# Patient Record
Sex: Female | Born: 1985 | Race: Black or African American | Hispanic: No | Marital: Single | Smoking: Never smoker
Health system: Southern US, Community
[De-identification: ages and names within clinical notes are randomized; demographics above are authoritative.]

## PROBLEM LIST (undated history)

## (undated) DIAGNOSIS — H9190 Unspecified hearing loss, unspecified ear: Secondary | ICD-10-CM

---
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
## 2020-11-28 ENCOUNTER — Other Ambulatory Visit: Payer: Self-pay

## 2020-12-17 ENCOUNTER — Other Ambulatory Visit: Payer: Self-pay

## 2020-12-20 ENCOUNTER — Other Ambulatory Visit: Payer: Self-pay

## 2020-12-20 ENCOUNTER — Emergency Department (HOSPITAL_COMMUNITY)
Admission: EM | Admit: 2020-12-20 | Discharge: 2020-12-20 | Disposition: A | Discharge status: Home / Self Care | Payer: BC Managed Care – PPO | Attending: Emergency Medicine | Admitting: Emergency Medicine

## 2020-12-20 DIAGNOSIS — R059 Cough, unspecified: Secondary | ICD-10-CM

## 2020-12-20 DIAGNOSIS — R0602 Shortness of breath: Secondary | ICD-10-CM | POA: Diagnosis present

## 2020-12-20 DIAGNOSIS — U071 COVID-19: Secondary | ICD-10-CM

## 2020-12-20 MED ORDER — ACETAMINOPHEN ER 650 MG PO TBCR: 650.0000 mg | EXTENDED_RELEASE_TABLET | Freq: Three times a day (TID) | ORAL | 0 refills | Status: DC | PRN

## 2020-12-20 MED ORDER — ONDANSETRON 4 MG PO TBDP: 4.0000 mg | ORAL_TABLET | Freq: Three times a day (TID) | ORAL | 0 refills | Status: DC | PRN

## 2020-12-20 MED ORDER — BENZONATATE 100 MG PO CAPS: 100.0000 mg | ORAL_CAPSULE | Freq: Three times a day (TID) | ORAL | 0 refills | Status: DC

## 2020-12-20 MED ORDER — ALBUTEROL SULFATE HFA 108 (90 BASE) MCG/ACT IN AERS: 2.0000 | INHALATION_SPRAY | RESPIRATORY_TRACT | Status: DC | PRN

## 2020-12-20 MED ORDER — BENZONATATE 100 MG PO CAPS
100.0000 mg | ORAL_CAPSULE | Freq: Once | ORAL | Status: AC
  Administered 2020-12-20: 100 mg via ORAL
  Filled 2020-12-20: qty 1

## 2020-12-20 NOTE — Progress Notes (Signed)
PT in Waiting Room- therefore Adult Wheeze Protocal per Dr. Eliot Ford order not completed at this time.

## 2020-12-20 NOTE — Discharge Instructions (Signed)
As discussed, I suspect her symptoms are related to Covid.  I am sending home with cough medication, nausea medication, and Tylenol.  Take as needed.  Please follow-up with PCP if symptoms not improved within the next week.  Return to the ER for new or worsening symptoms.  I have placed a referral for the antibody infusions.  You will receive a phone call if you qualify for the infusions.

## 2020-12-20 NOTE — ED Provider Notes (Signed)
[facility] DEPT Provider Note   CSN: [id number] Arrival date & time: 12/20/20  1650     History Chief Complaint  Patient presents with   Covid Positive   Cough    Caitlyn King is a 35 y.o. female with no significant past medical history presents to the ED due to severe cough and shortness of breath x1 week.  Patient tested positive for Covid on 12/19/2020 with symptom onset on 12/12/2020.  Patient admits to worsening dry cough.  She has been taking Robitussin and Mucinex with mild relief.  Patient also admits to shortness of breath worse when coughing.  Denies associated chest pain and lower extremity edema.  Denies history of blood clots, recent surgeries, recent long immobilizations, and hormonal treatments.  Patient requesting antibody infusion.  Patient is unvaccinated.  Admits to intermittent fevers.  Denies abdominal pain, nausea, vomiting, diarrhea.  History obtained from patient and past medical records. No interpreter used during encounter.      No past medical history on file.  There are no problems to display for this patient.    OB History   No obstetric history on file.     No family history on file.     Home Medications Prior to Admission medications   Medication Sig Start Date End Date Taking? Authorizing Provider  acetaminophen (TYLENOL 8 HOUR) 650 MG CR tablet Take 1 tablet (650 mg total) by mouth every 8 (eight) hours as needed for pain. 12/20/20  Yes Keo Schirmer, Druscilla Brownie, PA-C  benzonatate (TESSALON) 100 MG capsule Take 1 capsule (100 mg total) by mouth every 8 (eight) hours. 12/20/20  Yes Sandrina Heaton C, PA-C  ondansetron (ZOFRAN ODT) 4 MG disintegrating tablet Take 1 tablet (4 mg total) by mouth every 8 (eight) hours as needed for nausea or vomiting. 12/20/20  Yes Suzy Bouchard, PA-C    Allergies    Patient has no allergy information on record.  Review of Systems   Review of Systems  Constitutional: Positive for  chills and fever.  Respiratory: Positive for cough and shortness of breath.   Cardiovascular: Negative for chest pain and leg swelling.  Gastrointestinal: Negative for abdominal pain, diarrhea, nausea and vomiting.  All other systems reviewed and are negative.   Physical Exam Updated Vital Signs BP 100/74    Pulse 90    Temp 99.6 F (37.6 C) (Oral)    Resp 18    Ht 5\' 7"  (1.702 m)    Wt 108 kg    SpO2 97%    BMI 37.28 kg/m   Physical Exam Vitals and nursing note reviewed.  Constitutional:      General: She is not in acute distress.    Appearance: She is not ill-appearing.  HENT:     Head: Normocephalic.  Eyes:     Pupils: Pupils are equal, round, and reactive to light.  Cardiovascular:     Rate and Rhythm: Normal rate and regular rhythm.     Pulses: Normal pulses.     Heart sounds: Normal heart sounds. No murmur heard. No friction rub. No gallop.   Pulmonary:     Effort: Pulmonary effort is normal.     Breath sounds: Normal breath sounds.     Comments: Respirations equal and unlabored, patient able to speak in full sentences, lungs clear to auscultation bilaterally. Dry cough on exam. Abdominal:     General: Abdomen is flat. Bowel sounds are normal. There is no distension.  Palpations: Abdomen is soft.     Tenderness: There is no abdominal tenderness. There is no guarding or rebound.  Musculoskeletal:     Cervical back: Neck supple.     Comments: No lower extremity edema. Negative homan sign bilaterally.  Skin:    General: Skin is warm and dry.  Neurological:     General: No focal deficit present.     Mental Status: She is alert.  Psychiatric:        Mood and Affect: Mood normal.        Behavior: Behavior normal.     ED Results / Procedures / Treatments   Labs (all labs ordered are listed, but only abnormal results are displayed) Labs Reviewed - No data to display  EKG None  Radiology No results found.  Procedures Procedures (including critical care  time)  Medications Ordered in ED Medications  benzonatate (TESSALON) capsule 100 mg (100 mg Oral Given 12/20/20 1948)    ED Course  I have reviewed the triage vital signs and the nursing notes.  Pertinent labs & imaging results that were available during my care of the patient were reviewed by me and considered in my medical decision making (see chart for details).    MDM Rules/Calculators/A&P                         35 year old female presents to the ED due to cough and shortness of breath.  Patient has a positive for Covid on 12/19/2020.  She is currently unvaccinated.  Shortness of breath worse while coughing.  Denies associated chest pain and lower extremity edema.  Upon arrival, patient afebrile with mild tachycardia with O2 saturation at 92%.  During my initial evaluation vitals rechecked with heart rate in the 90s and O2 saturation at 97%.  Patient in no acute distress and nontoxic-appearing.  Physical exam reassuring.  Lungs clear to auscultation bilaterally.  No Rales, rhonchi, or wheeze.  Low suspicion for pneumonia.  No meningismus to suggest meningitis.  Abdomen soft, nondistended, nontender.  No lower extremity edema.  Negative Homans' sign bilaterally.  Low suspicion for PE/DVT.  Suspect symptoms related to Covid infection.  Patient able to ambulate here in the ED and maintained O2 saturation above 95%.  Will discharge with symptomatic treatment.  Quarantine guidelines discussed with patient.  Referral for Mab infusion. Strict ED precautions discussed with patient. Patient states understanding and agrees to plan. Patient discharged home in no acute distress and stable vitals  Caitlyn King was evaluated in Emergency Department on 12/20/2020 for the symptoms described in the history of present illness. She was evaluated in the context of the global COVID-19 pandemic, which necessitated consideration that the patient might be at risk for infection with the SARS-CoV-2 virus that causes  COVID-19. Institutional protocols and algorithms that pertain to the evaluation of patients at risk for COVID-19 are in a state of rapid change based on information released by regulatory bodies including the CDC and federal and state organizations. These policies and algorithms were followed during the patient's care in the ED.  Final Clinical Impression(s) / ED Diagnoses Final diagnoses:  [id number] virus infection  Cough    Rx / DC Orders ED Discharge Orders         Ordered    benzonatate (TESSALON) 100 MG capsule  Every 8 hours        12/20/20 1946    ondansetron (ZOFRAN ODT) 4 MG disintegrating tablet  Every 8  hours PRN        12/20/20 1946    acetaminophen (TYLENOL 8 HOUR) 650 MG CR tablet  Every 8 hours PRN        12/20/20 1946           Jesusita Oka 12/20/20 2027    Gwyneth Sprout, MD 12/24/20 2253

## 2020-12-20 NOTE — ED Notes (Signed)
Pt ambulated in room unassisted with a steady gait. Pt O2 reading 96% RA while ambulating.

## 2020-12-20 NOTE — ED Notes (Signed)
Pt verbalized dc instructions and follow up care. Alert and ambulatory. No iv. Vitals stable prior to dc  

## 2020-12-20 NOTE — ED Triage Notes (Signed)
Pt POV with reports recently being dx with COVID 19 on 12/19/20.  Pt c/o worsening cough, ShOB.  Pt able to speak in full sentences at time of triage, breathing unlabored.   Wants mAB infusion.

## 2020-12-23 ENCOUNTER — Other Ambulatory Visit: Payer: Self-pay

## 2020-12-23 ENCOUNTER — Emergency Department (HOSPITAL_COMMUNITY): Payer: BC Managed Care – PPO

## 2020-12-23 ENCOUNTER — Inpatient Hospital Stay (HOSPITAL_COMMUNITY)
Admission: EM | Admit: 2020-12-23 | Discharge: 2020-12-28 | DRG: 871 | Disposition: A | Discharge status: Home / Self Care | Payer: BC Managed Care – PPO | Attending: Family Medicine | Admitting: Family Medicine

## 2020-12-23 ENCOUNTER — Encounter (HOSPITAL_COMMUNITY): Payer: Self-pay

## 2020-12-23 DIAGNOSIS — R778 Other specified abnormalities of plasma proteins: Secondary | ICD-10-CM | POA: Diagnosis present

## 2020-12-23 DIAGNOSIS — T380X5A Adverse effect of glucocorticoids and synthetic analogues, initial encounter: Secondary | ICD-10-CM | POA: Diagnosis not present

## 2020-12-23 DIAGNOSIS — R0902 Hypoxemia: Secondary | ICD-10-CM

## 2020-12-23 DIAGNOSIS — E878 Other disorders of electrolyte and fluid balance, not elsewhere classified: Secondary | ICD-10-CM | POA: Diagnosis present

## 2020-12-23 DIAGNOSIS — E669 Obesity, unspecified: Secondary | ICD-10-CM | POA: Diagnosis present

## 2020-12-23 DIAGNOSIS — U071 COVID-19: Secondary | ICD-10-CM | POA: Diagnosis not present

## 2020-12-23 DIAGNOSIS — Z6837 Body mass index (BMI) 37.0-37.9, adult: Secondary | ICD-10-CM

## 2020-12-23 DIAGNOSIS — A4189 Other specified sepsis: Principal | ICD-10-CM | POA: Diagnosis present

## 2020-12-23 DIAGNOSIS — D649 Anemia, unspecified: Secondary | ICD-10-CM

## 2020-12-23 DIAGNOSIS — J1282 Pneumonia due to coronavirus disease 2019: Secondary | ICD-10-CM | POA: Diagnosis present

## 2020-12-23 DIAGNOSIS — R739 Hyperglycemia, unspecified: Secondary | ICD-10-CM | POA: Diagnosis not present

## 2020-12-23 DIAGNOSIS — R9431 Abnormal electrocardiogram [ECG] [EKG]: Secondary | ICD-10-CM | POA: Diagnosis present

## 2020-12-23 DIAGNOSIS — H9190 Unspecified hearing loss, unspecified ear: Secondary | ICD-10-CM | POA: Diagnosis present

## 2020-12-23 DIAGNOSIS — A419 Sepsis, unspecified organism: Secondary | ICD-10-CM

## 2020-12-23 DIAGNOSIS — N920 Excessive and frequent menstruation with regular cycle: Secondary | ICD-10-CM | POA: Diagnosis present

## 2020-12-23 DIAGNOSIS — J9601 Acute respiratory failure with hypoxia: Secondary | ICD-10-CM | POA: Diagnosis present

## 2020-12-23 DIAGNOSIS — D259 Leiomyoma of uterus, unspecified: Secondary | ICD-10-CM | POA: Diagnosis present

## 2020-12-23 DIAGNOSIS — D500 Iron deficiency anemia secondary to blood loss (chronic): Secondary | ICD-10-CM | POA: Diagnosis present

## 2020-12-23 DIAGNOSIS — E538 Deficiency of other specified B group vitamins: Secondary | ICD-10-CM | POA: Diagnosis present

## 2020-12-23 DIAGNOSIS — E871 Hypo-osmolality and hyponatremia: Secondary | ICD-10-CM | POA: Diagnosis present

## 2020-12-23 DIAGNOSIS — A0839 Other viral enteritis: Secondary | ICD-10-CM | POA: Diagnosis present

## 2020-12-23 HISTORY — DX: Unspecified hearing loss, unspecified ear: H91.90

## 2020-12-23 LAB — CBC WITH DIFFERENTIAL/PLATELET
Abs Immature Granulocytes: 0.02 10*3/uL (ref 0.00–0.07)
Basophils Absolute: 0 10*3/uL (ref 0.0–0.1)
Basophils Relative: 0 %
Eosinophils Absolute: 0 10*3/uL (ref 0.0–0.5)
Eosinophils Relative: 0 %
HCT: 13.9 % — ABNORMAL LOW (ref 36.0–46.0)
Hemoglobin: 3.4 g/dL — CL (ref 12.0–15.0)
Immature Granulocytes: 1 %
Lymphocytes Relative: 24 %
Lymphs Abs: 0.6 10*3/uL — ABNORMAL LOW (ref 0.7–4.0)
MCH: 15.1 pg — ABNORMAL LOW (ref 26.0–34.0)
MCHC: 24.5 g/dL — ABNORMAL LOW (ref 30.0–36.0)
MCV: 61.8 fL — ABNORMAL LOW (ref 80.0–100.0)
Monocytes Absolute: 0.3 10*3/uL (ref 0.1–1.0)
Monocytes Relative: 10 %
Neutro Abs: 1.8 10*3/uL (ref 1.7–7.7)
Neutrophils Relative %: 65 %
Platelets: 313 10*3/uL (ref 150–400)
RBC: 2.25 MIL/uL — ABNORMAL LOW (ref 3.87–5.11)
RDW: 22.8 % — ABNORMAL HIGH (ref 11.5–15.5)
WBC: 2.7 10*3/uL — ABNORMAL LOW (ref 4.0–10.5)
nRBC: 8.2 % — ABNORMAL HIGH (ref 0.0–0.2)

## 2020-12-23 LAB — COMPREHENSIVE METABOLIC PANEL
ALT: 14 U/L (ref 0–44)
AST: 32 U/L (ref 15–41)
Albumin: 3.3 g/dL — ABNORMAL LOW (ref 3.5–5.0)
Alkaline Phosphatase: 58 U/L (ref 38–126)
Anion gap: 11 (ref 5–15)
BUN: 16 mg/dL (ref 6–20)
CO2: 24 mmol/L (ref 22–32)
Calcium: 8.1 mg/dL — ABNORMAL LOW (ref 8.9–10.3)
Chloride: 91 mmol/L — ABNORMAL LOW (ref 98–111)
Creatinine, Ser: 0.88 mg/dL (ref 0.44–1.00)
GFR, Estimated: >60 mL/min (ref >60)
Glucose, Bld: 107 mg/dL — ABNORMAL HIGH (ref 70–99)
Potassium: 4.2 mmol/L (ref 3.5–5.1)
Sodium: 126 mmol/L — ABNORMAL LOW (ref 135–145)
Total Bilirubin: 0.8 mg/dL (ref 0.3–1.2)
Total Protein: 6.3 g/dL — ABNORMAL LOW (ref 6.5–8.1)

## 2020-12-23 LAB — I-STAT BETA HCG BLOOD, ED (MC, WL, AP ONLY): I-stat hCG, quantitative: <5 m[IU]/mL (ref <5)

## 2020-12-23 LAB — RESP PANEL BY RT-PCR (FLU A&B, COVID) ARPGX2
Influenza A by PCR: NEGATIVE
Influenza B by PCR: NEGATIVE
SARS Coronavirus 2 by RT PCR: POSITIVE — AB

## 2020-12-23 LAB — ABO/RH: ABO/RH(D): O POS

## 2020-12-23 LAB — POC SARS CORONAVIRUS 2 AG -  ED: SARS Coronavirus 2 Ag: NEGATIVE

## 2020-12-23 LAB — TROPONIN I (HIGH SENSITIVITY)
Troponin I (High Sensitivity): 26 ng/L — ABNORMAL HIGH (ref <18)
Troponin I (High Sensitivity): 24 ng/L — ABNORMAL HIGH (ref <18)

## 2020-12-23 LAB — POC OCCULT BLOOD, ED: Fecal Occult Bld: NEGATIVE

## 2020-12-23 LAB — LACTIC ACID, PLASMA: Lactic Acid, Venous: 1.5 mmol/L (ref 0.5–1.9)

## 2020-12-23 LAB — PROTIME-INR
INR: 1.1 (ref 0.8–1.2)
Prothrombin Time: 14 seconds (ref 11.4–15.2)

## 2020-12-23 LAB — D-DIMER, QUANTITATIVE: D-Dimer, Quant: 1.39 ug/mL-FEU — ABNORMAL HIGH (ref 0.00–0.50)

## 2020-12-23 LAB — PREPARE RBC (CROSSMATCH)

## 2020-12-23 MED ORDER — SODIUM CHLORIDE 0.9 % IV BOLUS
1000.0000 mL | Freq: Once | INTRAVENOUS | Status: AC
  Administered 2020-12-23: 1000 mL via INTRAVENOUS

## 2020-12-23 MED ORDER — IOHEXOL 350 MG/ML SOLN
100.0000 mL | Freq: Once | INTRAVENOUS | Status: AC | PRN
  Administered 2020-12-23: 100 mL via INTRAVENOUS

## 2020-12-23 MED ORDER — SODIUM CHLORIDE 0.9% IV SOLUTION: Freq: Once | INTRAVENOUS | Status: AC

## 2020-12-23 MED ORDER — METHYLPREDNISOLONE SODIUM SUCC 125 MG IJ SOLR
125.0000 mg | Freq: Once | INTRAMUSCULAR | Status: AC
  Administered 2020-12-23: 125 mg via INTRAVENOUS
  Filled 2020-12-23: qty 2

## 2020-12-23 NOTE — ED Triage Notes (Signed)
Current SPO2 (on 5 l.p.m.) is 91% and she remains in n o distress.

## 2020-12-23 NOTE — ED Notes (Signed)
Date and time results received: 12/23/20 2038  (use smartphrase ".now" to insert current time)  Test: Hgb Critical Value: 3.4  Name of Provider Notified: Dr. Dina Rich  Orders Received? Or Actions Taken?: Orders Received

## 2020-12-23 NOTE — ED Provider Notes (Signed)
[facility] DEPT Provider Note   CSN: [id number] Arrival date & time: 12/23/20  1806     History Chief Complaint  Patient presents with  . URI    Caitlyn King is a 35 y.o. female.  HPI   35 year old female who is hard of hearing presents the emergency department with concern for fatigue and shortness of breath.  Patient was diagnosed COVID-positive on 12/19/2020.  She states since then she has been feeling very fatigued, weak and today had worsening shortness of breath and cough.  She has had intermittent chest pain when she has coughing fits.  She states that she has been febrile at home.  Taking over-the-counter medication without significant relief.  She said decreased appetite and p.o. intake.  No swelling of her lower extremities.  She was noted to be hypoxic on room air in triage.  Past Medical History:  Diagnosis Date  . Hard of hearing     There are no problems to display for this patient.   History reviewed. No pertinent surgical history.   OB History   No obstetric history on file.     History reviewed. No pertinent family history.  Social History   Tobacco Use  . Smoking status: Never Smoker  . Smokeless tobacco: Never Used  Substance Use Topics  . Alcohol use: Never    Home Medications Prior to Admission medications   Medication Sig Start Date End Date Taking? Authorizing Provider  acetaminophen (TYLENOL 8 HOUR) 650 MG CR tablet Take 1 tablet (650 mg total) by mouth every 8 (eight) hours as needed for pain. 12/20/20   Suzy Bouchard, PA-C  benzonatate (TESSALON) 100 MG capsule Take 1 capsule (100 mg total) by mouth every 8 (eight) hours. 12/20/20   Suzy Bouchard, PA-C  ondansetron (ZOFRAN ODT) 4 MG disintegrating tablet Take 1 tablet (4 mg total) by mouth every 8 (eight) hours as needed for nausea or vomiting. 12/20/20   Suzy Bouchard, PA-C    Allergies    Patient has no known allergies.  Review of Systems    Review of Systems  Constitutional: Positive for appetite change, chills, fatigue and fever.  HENT: Positive for sinus pressure. Negative for congestion.   Eyes: Negative for visual disturbance.  Respiratory: Positive for cough, chest tightness and shortness of breath.   Cardiovascular: Positive for chest pain. Negative for palpitations and leg swelling.  Gastrointestinal: Negative for abdominal pain, diarrhea and vomiting.  Genitourinary: Negative for dysuria.  Musculoskeletal: Positive for myalgias.  Skin: Negative for rash.  Neurological: Positive for headaches.    Physical Exam Updated Vital Signs BP (!) 126/43 (BP Location: Right Arm)   Pulse (!) 112   Temp 98.4 F (36.9 C) (Oral)   Resp 20   LMP 12/04/2020 (Approximate)   SpO2 (!) 71%   Physical Exam Vitals and nursing note reviewed.  Constitutional:      Appearance: Normal appearance.  HENT:     Head: Normocephalic.     Mouth/Throat:     Mouth: Mucous membranes are moist.  Cardiovascular:     Rate and Rhythm: Tachycardia present.  Pulmonary:     Effort: Pulmonary effort is normal. No respiratory distress.     Breath sounds: Rales present.     Comments: On 5 L of nasal cannula Abdominal:     Palpations: Abdomen is soft.     Tenderness: There is no abdominal tenderness.  Musculoskeletal:        General: No  swelling.  Skin:    General: Skin is warm.  Neurological:     Mental Status: She is alert and oriented to person, place, and time. Mental status is at baseline.  Psychiatric:        Mood and Affect: Mood normal.     ED Results / Procedures / Treatments   Labs (all labs ordered are listed, but only abnormal results are displayed) Labs Reviewed  CULTURE, BLOOD (SINGLE)  CBC WITH DIFFERENTIAL/PLATELET  COMPREHENSIVE METABOLIC PANEL  D-DIMER, QUANTITATIVE (NOT AT [facility])  LACTIC ACID, PLASMA  LACTIC ACID, PLASMA  I-STAT BETA HCG BLOOD, ED ([facility], [facility], [facility] ONLY)  TROPONIN I (HIGH SENSITIVITY)     EKG None  Radiology No results found.  Procedures Procedures (including critical care time)  Medications Ordered in ED Medications  methylPREDNISolone sodium succinate (SOLU-MEDROL) 125 mg/2 mL injection 125 mg (has no administration in time range)  sodium chloride 0.9 % bolus 1,000 mL (has no administration in time range)    ED Course  I have reviewed the triage vital signs and the nursing notes.  Pertinent labs & imaging results that were available during my care of the patient were reviewed by me and considered in my medical decision making (see chart for details).    MDM Rules/Calculators/A&P                          35 year old female presents the emergency department for shortness of breath and fatigue after being diagnosed COVID-positive.  She was hypoxic in triage down to the 70s, comfortable on 5 L nasal cannula, appears fatigued but in no respiratory distress.  Patient is COVID-positive, chest x-ray shows bilateral findings consistent with COVID.  CTA is inconclusive due to motion but no large/central pulmonary embolism.  Blood work shows an anemia of 3.4, patient reports a history of anemia but unclear what her baseline is.  Fecal occult is negative, she is not anticoagulated.  Transfusion has been ordered.  Mild hyponatremia, tachycardia has resolved with hydration.  Blood pressure is stable.  She continues to be stable on the 6 L nasal cannula.  Consulted with ICU and we feel patient is stable enough for hospitalist service.  Hospitalist excepting, patients evaluation and results requires admission for further treatment and care. Patient agrees with admission plan, offers no new complaints and is stable/unchanged at time of admit.  Final Clinical Impression(s) / ED Diagnoses Final diagnoses:  None    Rx / DC Orders ED Discharge Orders    None       Lorelle Gibbs, DO 12/24/20 0013

## 2020-12-23 NOTE — ED Triage Notes (Signed)
She tells me she has felt "weak" x 1 week. She also tells me she was dx with COVID four days ago. We find her rm. Air at-rest SPO2 in triage to be 71%. I rapidly titrate her O2 up to 5 l.p.m., which has her SPO2 aT 89%. She is in no distress, although a bit pale in appearance. She denies any pain.

## 2020-12-23 NOTE — ED Notes (Signed)
Blood transfusion increased to 241mL/hr per MD request.  Pt tolerating well.  No signs of distress noted.  Will continue to monitor.

## 2020-12-24 ENCOUNTER — Inpatient Hospital Stay (HOSPITAL_COMMUNITY): Payer: BC Managed Care – PPO

## 2020-12-24 DIAGNOSIS — R7989 Other specified abnormal findings of blood chemistry: Secondary | ICD-10-CM | POA: Diagnosis not present

## 2020-12-24 DIAGNOSIS — R9431 Abnormal electrocardiogram [ECG] [EKG]: Secondary | ICD-10-CM | POA: Diagnosis present

## 2020-12-24 DIAGNOSIS — E878 Other disorders of electrolyte and fluid balance, not elsewhere classified: Secondary | ICD-10-CM | POA: Diagnosis present

## 2020-12-24 DIAGNOSIS — D649 Anemia, unspecified: Secondary | ICD-10-CM

## 2020-12-24 DIAGNOSIS — D259 Leiomyoma of uterus, unspecified: Secondary | ICD-10-CM | POA: Diagnosis present

## 2020-12-24 DIAGNOSIS — Z6837 Body mass index (BMI) 37.0-37.9, adult: Secondary | ICD-10-CM | POA: Diagnosis not present

## 2020-12-24 DIAGNOSIS — A419 Sepsis, unspecified organism: Secondary | ICD-10-CM

## 2020-12-24 DIAGNOSIS — D500 Iron deficiency anemia secondary to blood loss (chronic): Secondary | ICD-10-CM | POA: Diagnosis present

## 2020-12-24 DIAGNOSIS — J1282 Pneumonia due to coronavirus disease 2019: Secondary | ICD-10-CM | POA: Diagnosis present

## 2020-12-24 DIAGNOSIS — R739 Hyperglycemia, unspecified: Secondary | ICD-10-CM | POA: Diagnosis not present

## 2020-12-24 DIAGNOSIS — A4189 Other specified sepsis: Secondary | ICD-10-CM | POA: Diagnosis present

## 2020-12-24 DIAGNOSIS — U071 COVID-19: Secondary | ICD-10-CM | POA: Diagnosis present

## 2020-12-24 DIAGNOSIS — E669 Obesity, unspecified: Secondary | ICD-10-CM | POA: Diagnosis present

## 2020-12-24 DIAGNOSIS — E871 Hypo-osmolality and hyponatremia: Secondary | ICD-10-CM | POA: Diagnosis present

## 2020-12-24 DIAGNOSIS — R778 Other specified abnormalities of plasma proteins: Secondary | ICD-10-CM | POA: Diagnosis present

## 2020-12-24 DIAGNOSIS — J9601 Acute respiratory failure with hypoxia: Secondary | ICD-10-CM

## 2020-12-24 DIAGNOSIS — H9190 Unspecified hearing loss, unspecified ear: Secondary | ICD-10-CM | POA: Diagnosis present

## 2020-12-24 DIAGNOSIS — T380X5A Adverse effect of glucocorticoids and synthetic analogues, initial encounter: Secondary | ICD-10-CM | POA: Diagnosis not present

## 2020-12-24 DIAGNOSIS — N920 Excessive and frequent menstruation with regular cycle: Secondary | ICD-10-CM | POA: Diagnosis present

## 2020-12-24 DIAGNOSIS — A0839 Other viral enteritis: Secondary | ICD-10-CM | POA: Diagnosis present

## 2020-12-24 DIAGNOSIS — E538 Deficiency of other specified B group vitamins: Secondary | ICD-10-CM | POA: Diagnosis present

## 2020-12-24 LAB — FOLATE: Folate: 4.2 ng/mL — ABNORMAL LOW (ref >5.9)

## 2020-12-24 LAB — PREPARE RBC (CROSSMATCH)

## 2020-12-24 LAB — OSMOLALITY: Osmolality: 287 mOsm/kg (ref 275–295)

## 2020-12-24 LAB — C-REACTIVE PROTEIN: CRP: 9.2 mg/dL — ABNORMAL HIGH (ref <1.0)

## 2020-12-24 LAB — RETICULOCYTES
RBC.: 3.31 MIL/uL — ABNORMAL LOW (ref 3.87–5.11)
Retic Ct Pct: <0.4 % — ABNORMAL LOW (ref 0.4–3.1)

## 2020-12-24 LAB — HIV ANTIBODY (ROUTINE TESTING W REFLEX): HIV Screen 4th Generation wRfx: NONREACTIVE

## 2020-12-24 LAB — PROCALCITONIN: Procalcitonin: 0.35 ng/mL

## 2020-12-24 LAB — IRON AND TIBC
Iron: 13 ug/dL — ABNORMAL LOW (ref 28–170)
Saturation Ratios: 4 % — ABNORMAL LOW (ref 10.4–31.8)
TIBC: 353 ug/dL (ref 250–450)
UIBC: 340 ug/dL

## 2020-12-24 LAB — FERRITIN: Ferritin: 11 ng/mL (ref 11–307)

## 2020-12-24 LAB — HEMOGLOBIN AND HEMATOCRIT, BLOOD
HCT: 24.3 % — ABNORMAL LOW (ref 36.0–46.0)
HCT: 28.9 % — ABNORMAL LOW (ref 36.0–46.0)
Hemoglobin: 6.9 g/dL — CL (ref 12.0–15.0)
Hemoglobin: 8.2 g/dL — ABNORMAL LOW (ref 12.0–15.0)

## 2020-12-24 LAB — LIPASE, BLOOD: Lipase: 25 U/L (ref 11–51)

## 2020-12-24 LAB — LACTATE DEHYDROGENASE: LDH: 333 U/L — ABNORMAL HIGH (ref 98–192)

## 2020-12-24 LAB — MAGNESIUM: Magnesium: 2.2 mg/dL (ref 1.7–2.4)

## 2020-12-24 LAB — FIBRINOGEN: Fibrinogen: 512 mg/dL — ABNORMAL HIGH (ref 210–475)

## 2020-12-24 LAB — VITAMIN B12: Vitamin B-12: 219 pg/mL (ref 180–914)

## 2020-12-24 LAB — TRIGLYCERIDES: Triglycerides: 128 mg/dL (ref <150)

## 2020-12-24 MED ORDER — SODIUM CHLORIDE 0.9% IV SOLUTION: Freq: Once | INTRAVENOUS | Status: AC

## 2020-12-24 MED ORDER — ZINC SULFATE 220 (50 ZN) MG PO CAPS
220.0000 mg | ORAL_CAPSULE | Freq: Every day | ORAL | Status: DC
  Administered 2020-12-24 – 2020-12-28 (×5): 220 mg via ORAL
  Filled 2020-12-24 (×5): qty 1

## 2020-12-24 MED ORDER — PREDNISONE 20 MG PO TABS
50.0000 mg | ORAL_TABLET | Freq: Every day | ORAL | Status: DC
  Administered 2020-12-27 – 2020-12-28 (×2): 50 mg via ORAL
  Filled 2020-12-24: qty 1
  Filled 2020-12-24: qty 2

## 2020-12-24 MED ORDER — GUAIFENESIN-DM 100-10 MG/5ML PO SYRP
10.0000 mL | ORAL_SOLUTION | ORAL | Status: DC | PRN
  Administered 2020-12-25: 22:00:00 10 mL via ORAL
  Filled 2020-12-24: qty 10

## 2020-12-24 MED ORDER — SODIUM CHLORIDE 0.9 % IV SOLN
200.0000 mg | Freq: Once | INTRAVENOUS | Status: AC
  Administered 2020-12-24: 200 mg via INTRAVENOUS
  Filled 2020-12-24: qty 200

## 2020-12-24 MED ORDER — ENOXAPARIN SODIUM 40 MG/0.4ML ~~LOC~~ SOLN: 40.0000 mg | SUBCUTANEOUS | Status: DC

## 2020-12-24 MED ORDER — SODIUM CHLORIDE 0.9 % IV SOLN
2.0000 g | INTRAVENOUS | Status: DC
  Administered 2020-12-24 – 2020-12-27 (×4): 2 g via INTRAVENOUS
  Filled 2020-12-24: qty 20
  Filled 2020-12-24: qty 2
  Filled 2020-12-24: qty 20
  Filled 2020-12-24 (×2): qty 2

## 2020-12-24 MED ORDER — ASCORBIC ACID 500 MG PO TABS
500.0000 mg | ORAL_TABLET | Freq: Every day | ORAL | Status: DC
  Administered 2020-12-24 – 2020-12-28 (×5): 500 mg via ORAL
  Filled 2020-12-24 (×5): qty 1

## 2020-12-24 MED ORDER — SODIUM CHLORIDE 0.9 % IV SOLN: INTRAVENOUS | Status: DC

## 2020-12-24 MED ORDER — SODIUM CHLORIDE 0.9 % IV SOLN
100.0000 mg | Freq: Every day | INTRAVENOUS | Status: AC
  Administered 2020-12-25 – 2020-12-28 (×4): 100 mg via INTRAVENOUS
  Filled 2020-12-24 (×4): qty 20

## 2020-12-24 MED ORDER — LOPERAMIDE HCL 2 MG PO CAPS: 2.0000 mg | ORAL_CAPSULE | ORAL | Status: DC | PRN

## 2020-12-24 MED ORDER — BARICITINIB 2 MG PO TABS
4.0000 mg | ORAL_TABLET | Freq: Every day | ORAL | Status: DC
  Administered 2020-12-24 – 2020-12-27 (×4): 4 mg via ORAL
  Filled 2020-12-24 (×5): qty 2

## 2020-12-24 MED ORDER — FOLIC ACID 1 MG PO TABS
1.0000 mg | ORAL_TABLET | Freq: Every day | ORAL | Status: DC
  Administered 2020-12-24 – 2020-12-28 (×5): 1 mg via ORAL
  Filled 2020-12-24 (×5): qty 1

## 2020-12-24 MED ORDER — HYDROCOD POLST-CPM POLST ER 10-8 MG/5ML PO SUER
5.0000 mL | Freq: Two times a day (BID) | ORAL | Status: DC | PRN
Start: 2020-12-24 — End: 2020-12-28
  Administered 2020-12-24: 5 mL via ORAL
  Filled 2020-12-24: qty 5

## 2020-12-24 MED ORDER — ALBUTEROL SULFATE HFA 108 (90 BASE) MCG/ACT IN AERS
2.0000 | INHALATION_SPRAY | Freq: Four times a day (QID) | RESPIRATORY_TRACT | Status: DC
  Administered 2020-12-24 – 2020-12-28 (×19): 2 via RESPIRATORY_TRACT
  Filled 2020-12-24: qty 6.7

## 2020-12-24 MED ORDER — PROCHLORPERAZINE EDISYLATE 10 MG/2ML IJ SOLN: 5.0000 mg | Freq: Four times a day (QID) | INTRAMUSCULAR | Status: DC | PRN

## 2020-12-24 MED ORDER — METHYLPREDNISOLONE SODIUM SUCC 125 MG IJ SOLR
0.5000 mg/kg | Freq: Two times a day (BID) | INTRAMUSCULAR | Status: AC
  Administered 2020-12-24 – 2020-12-26 (×6): 53.75 mg via INTRAVENOUS
  Filled 2020-12-24 (×6): qty 2

## 2020-12-24 MED ORDER — AZITHROMYCIN 500 MG IV SOLR
500.0000 mg | INTRAVENOUS | Status: DC
  Administered 2020-12-24 – 2020-12-27 (×4): 500 mg via INTRAVENOUS
  Filled 2020-12-24 (×5): qty 500

## 2020-12-24 MED ORDER — VITAMIN D 25 MCG (1000 UNIT) PO TABS
1000.0000 [IU] | ORAL_TABLET | Freq: Every day | ORAL | Status: DC
  Administered 2020-12-24 – 2020-12-28 (×5): 1000 [IU] via ORAL
  Filled 2020-12-24 (×5): qty 1

## 2020-12-24 MED ORDER — FUROSEMIDE 10 MG/ML IJ SOLN
40.0000 mg | Freq: Once | INTRAMUSCULAR | Status: AC
  Administered 2020-12-24: 40 mg via INTRAVENOUS
  Filled 2020-12-24: qty 4

## 2020-12-24 MED ORDER — ACETAMINOPHEN 325 MG PO TABS
650.0000 mg | ORAL_TABLET | Freq: Four times a day (QID) | ORAL | Status: DC | PRN
  Administered 2020-12-24: 650 mg via ORAL
  Filled 2020-12-24: qty 2

## 2020-12-24 NOTE — Progress Notes (Signed)
PROGRESS NOTE  Brief Narrative: Caitlyn King is a 35 y.o. female with a history of obesity, HOH, menorrhagia, covid-19 diagnosed 1/5, initially presented to ED 1/6 with shortness of breath but was not hypoxic and discharged home with referral for monoclonal antibody, and returned to the ED 1/9 with worsening symptoms, namely fatigue and dyspnea.   Subjective: Feels fatigued with cough and shortness of breath. No chest pain currently.  Objective: BP 118/60   Pulse 86   Temp 98.5 F (36.9 C) (Oral)   Resp (!) 31   LMP 12/04/2020 (Approximate)   SpO2 94%   Gen: Obese female in no acute distress Pulm: Crackles diffusely, very decreased throughout and at bases. No wheezes.  CV: RRR, no murmur, no JVD, no edema GI: Soft, NT, ND, +BS  Neuro: Alert and oriented. No focal deficits. Skin: No rashes, lesions or ulcers on visualized skin.  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  - Suspect a chronic aspect to anemia due to stable vital signs on presentation. FOBT negative. +fibroids on U/S. Will need GYN follow up, consideration of suppression.  - s/p 3u PRBCs, hgb still slightly <7 as expected, reorder 1u PRBC more and repeat H/H tonight.  - Supplement folic acid - Consider IV iron before discharge. Note negative reticulocyte count.  Acute hypoxic respiratory failure due to covid-19 pneumonia: SARS-CoV-2 positive on 1/5.  - Continue remdesivir, covid +1/5. Prior symptoms may be attributable to severe anemia. - Continue steroids due to hypoxemia and negative FOBT - Continue  baricitinib - On my personal review of CTA chest, there are dense opacities bilaterally with lower lobe predominance, will empirically add antibiotics. Repeat CXR in AM. Give lasix x1 with significant transfusion volume and hypoxia that is worse from admission.  - Encourage OOB, IS, FV, and awake proning if able - Continue airborne, contact precautions for 21 days from positive testing. - Monitor CMP and inflammatory markers - Enoxaparin prophylactic dose.      Troponin elevation: Mild, downtrending without anginal features.  - Consider risk stratification after acute illness.   Patrecia Pour, MD Pager on amion 12/24/2020, 3:37 PM

## 2020-12-24 NOTE — Evaluation (Signed)
Physical Therapy Evaluation Patient Details Name: Caitlyn King MRN: [id number] DOB: [birth year] Today's Date: 12/24/2020   History of Present Illness  Caitlyn King is a 35 y.o. female with medical history significant of difficulty hearing, obesity .She had a positive outpatient Covid test on 12/19/2020.  She was seen in the ED on 1/6 for cough and shortness of breath.  She was discharged with referral for monoclonal antibody infusion.  Presenting to the ED1/9/22  with complaints of worsening shortness of breath and fatigue. CT angiogram chest  showing no visible large or central PE.  Showing extensive bilateral airspace disease compatible with COVID-19 pneumonia  Clinical Impression  The patient  Required very little assistance to mobilize, transfer to [facility] and back to bed.Patient did require multimodal cues to complete self hygiene tasks. ? Due to [facility] status.  SPO2 on 6 L [place] remained  >91%. On RA briefly, dropped to 85%.   Patient  Should progress to return home. PTA,  Patient's mother has been staying with her to assist.  Pt admitted with above diagnosis.  Pt currently with functional limitations due to the deficits listed below (see PT Problem List). Pt will benefit from skilled PT to increase their independence and safety with mobility to allow discharge to the venue listed below.        Follow Up Recommendations No PT follow up    Equipment Recommendations   (TBD)    Recommendations for Other Services       Precautions / Restrictions Precautions Precaution Comments: moniltor sats/HR  HOH     Mobility  Bed Mobility Overal bed mobility: Needs Assistance Bed Mobility: Supine to Sit;Sit to Supine     Supine to sit: Supervision Sit to supine: Supervision   General bed mobility comments: no external assitance required    Transfers Overall transfer level: Needs assistance   Transfers: Sit to/from Stand;Stand Pivot Transfers Sit to Stand: Min guard Stand pivot transfers: Min  guard       General transfer comment: assist with lines, cues to transfer to [facility] and back to bed  Ambulation/Gait             General Gait Details: TBA  Stairs            Wheelchair Mobility    Modified Rankin (Stroke Patients Only)       Balance Overall balance assessment: Mild deficits observed, not formally tested                                           Pertinent Vitals/Pain Pain Assessment: No/denies pain    Home Living Family/patient expects to be discharged to:: Private residence Living Arrangements: Alone;Parent Available Help at Discharge: Family;Available 24 hours/day Type of Home: House Home Access: Stairs to enter   [employer] of Steps: 3 Home Layout: One level Home Equipment: None      Prior Function Level of Independence: Independent               Hand Dominance   Dominant Hand: Right    Extremity/Trunk Assessment   Upper Extremity Assessment Upper Extremity Assessment: Overall WFL for tasks assessed    Lower Extremity Assessment Lower Extremity Assessment: Overall WFL for tasks assessed    Cervical / Trunk Assessment Cervical / Trunk Assessment: Normal  Communication   Communication: HOH  Cognition Arousal/Alertness: Awake/alert Behavior During Therapy: WFL for  tasks assessed/performed Overall Cognitive Status: Within Functional Limits for tasks assessed                                 General Comments: Appered slow to follow instructions, could be due to [facility]. Required specific instructions to wash periarea after toileting after she was given a washcloth.      General Comments      Exercises Other Exercises Other Exercises: IS x 5, very little effort   Assessment/Plan    PT Assessment Patient needs continued PT services  PT Problem List Decreased strength;Decreased knowledge of precautions;Decreased mobility;Decreased activity tolerance;Cardiopulmonary status  limiting activity       PT Treatment Interventions DME instruction;Therapeutic activities;Gait training;Patient/family education;Functional mobility training;Therapeutic exercise    PT Goals (Current goals can be found in the Care Plan section)  Acute Rehab PT Goals Patient Stated Goal: to go home PT Goal Formulation: With patient Time For Goal Achievement: 01/07/21 Potential to Achieve Goals: Good    Frequency Min 3X/week   Barriers to discharge        Co-evaluation               AM-PAC PT "6 Clicks" Mobility  Outcome Measure Help needed turning from your back to your side while in a flat bed without using bedrails?: None Help needed moving from lying on your back to sitting on the side of a flat bed without using bedrails?: None Help needed moving to and from a bed to a chair (including a wheelchair)?: A Little Help needed standing up from a chair using your arms (e.g., wheelchair or bedside chair)?: A Little Help needed to walk in hospital room?: A Little Help needed climbing 3-5 steps with a railing? : A Lot 6 Click Score: 19    End of Session Equipment Utilized During Treatment: Oxygen Activity Tolerance: Patient tolerated treatment well Patient left: in bed;with call bell/phone within reach Nurse Communication: Mobility status PT Visit Diagnosis: Unsteadiness on feet (R26.81);Difficulty in walking, not elsewhere classified (R26.2)    Time: 2585-2778 PT Time Calculation (min) (ACUTE ONLY): 39 min   Charges:   PT Evaluation $PT Eval Low Complexity: 1 Low PT Treatments $Therapeutic Activity: 8-22 mins $Self Care/Home Management: [place] Pager [phone] Office [phone]   Claretha Cooper 12/24/2020, 1:27 PM

## 2020-12-24 NOTE — Plan of Care (Signed)
  Problem: Education: Goal: Knowledge of General Education information will improve Description: Including pain rating scale, medication(s)/side effects and non-pharmacologic comfort measures Outcome: Progressing   Problem: Clinical Measurements: Goal: Will remain free from infection Outcome: Progressing Goal: Respiratory complications will improve Outcome: Progressing   Problem: Coping: Goal: Level of anxiety will decrease Outcome: Progressing   Problem: Education: Goal: Knowledge of risk factors and measures for prevention of condition will improve Outcome: Progressing   Problem: Respiratory: Goal: Will maintain a patent airway Outcome: Progressing

## 2020-12-24 NOTE — H&P (Addendum)
History and Physical    JENALIS DUTCH [id number] DOB: 1986/09/03 DOA: 12/23/2020  PCP: Patient, No Pcp Per Patient coming from: Home  Chief Complaint: Shortness of breath  HPI: Caitlyn King is a 35 y.o. female with medical history significant of difficulty hearing, obesity (BMI 37.28) but no other significant medical problems.  She had a positive outpatient Covid test on 12/19/2020.  She was seen in the ED on 1/6 for cough and shortness of breath x1 week.  She was not hypoxic at that time and maintaining sats around 96% with ambulation.  No labs or chest x-ray done during this visit.  She was discharged with referral for monoclonal antibody infusion.  She is now presenting to the ED with complaints of worsening shortness of breath and fatigue.  She is not vaccinated against COVID.  Patient states she has been feeling ill for over a week.  She is having severe fatigue, shortness of breath, cough, chest pain, fevers, chills, body aches, nausea, vomiting, and diarrhea.  Denies abdominal pain.  Denies hematemesis, hematochezia, or melena.  Does state that she has had heavy menstrual cycles for a very long time.  She has never been evaluated by a gynecologist.  States normally her menstrual cycle is about 6 to 7 days long and she goes through multiple pads each day.  States about 2 years ago she was in a weight loss program where they did labs periodically and she was told she was anemic.  She does not have a primary care physician and has never been treated for her anemia.  She is not a vegetarian or vegan.  Denies history of thalassemia.  Denies family history of anemia.  ED Course: Afebrile.  Slightly tachycardic and tachypneic.  Not hypotensive.  SPO2 71% on room air, improved with 5 to 6 L supplemental oxygen.  SARS-CoV-2 rapid antigen test negative, however, PCR test positive.  Influenza panel negative.  WBC 2.7, hemoglobin 3.4, hematocrit 13.9, MCV 61.8, platelet count 313K.  Sodium 126, potassium  4.2, chloride 91, bicarb 24, BUN 16, creatinine 0.8, glucose 107.  LFTs normal.  Lactic acid within normal range.  High-sensitivity troponin mildly elevated but stable (26> 24).  D-dimer 1.39.  Beta hCG negative.  Blood culture pending.  INR 1.1.  FOBT negative.  Chest x-ray personally reviewed showing significant bibasilar pulmonary infiltrates.  CT angiogram chest (severely limited study due to respiratory motion and coughing) showing no visible large or central PE.  Showing extensive bilateral airspace disease compatible with COVID-19 pneumonia.  ED provider discussed the case with critical care who felt that the patient was stable for admission under hospitalist service.  Patient was given IV Solu-Medrol 125 mg and 1 L normal saline bolus.  Review of Systems:  All systems reviewed and apart from history of presenting illness, are negative.  Past Medical History:  Diagnosis Date  . Hard of hearing     History reviewed. No pertinent surgical history.   reports that she has never smoked. She has never used smokeless tobacco. She reports that she does not drink alcohol. No history on file for drug use.  No Known Allergies  History reviewed. No pertinent family history.  Prior to Admission medications   Medication Sig Start Date End Date Taking? Authorizing Provider  acetaminophen (TYLENOL 8 HOUR) 650 MG CR tablet Take 1 tablet (650 mg total) by mouth every 8 (eight) hours as needed for pain. 12/20/20   Suzy Bouchard, PA-C  benzonatate (TESSALON) 100 MG  capsule Take 1 capsule (100 mg total) by mouth every 8 (eight) hours. 12/20/20   Suzy Bouchard, PA-C  ondansetron (ZOFRAN ODT) 4 MG disintegrating tablet Take 1 tablet (4 mg total) by mouth every 8 (eight) hours as needed for nausea or vomiting. 12/20/20   Suzy Bouchard, PA-C    Physical Exam: Vitals:   12/24/20 0045 12/24/20 0115 12/24/20 0145 12/24/20 0200  BP: (!) 109/50 (!) 109/55 (!) 110/48 (!) 108/57  Pulse: 92 98  88 89  Resp: (!) 31 (!) 23 (!) 30 (!) 35  Temp:      TempSrc:      SpO2: 94% 96% 93% 95%    Physical Exam Constitutional:      General: She is not in acute distress. HENT:     Head: Normocephalic and atraumatic.  Eyes:     Extraocular Movements: Extraocular movements intact.     Conjunctiva/sclera: Conjunctivae normal.  Cardiovascular:     Rate and Rhythm: Normal rate and regular rhythm.     Pulses: Normal pulses.  Pulmonary:     Breath sounds: No wheezing.     Comments: Tachypneic with respiratory rate up to 30 Coarse breath sounds up to mid lung fields bilaterally Satting in the mid 90s on 6 L supplemental oxygen via nasal cannula Abdominal:     General: Bowel sounds are normal. There is no distension.     Palpations: Abdomen is soft.     Tenderness: There is no abdominal tenderness. There is no guarding or rebound.  Musculoskeletal:        General: No swelling or tenderness.     Cervical back: Normal range of motion and neck supple.  Skin:    General: Skin is warm and dry.     Coloration: Skin is pale.  Neurological:     General: No focal deficit present.     Mental Status: She is alert and oriented to person, place, and time.     Labs on Admission: I have personally reviewed following labs and imaging studies  CBC: Recent Labs  Lab 12/23/20 2002  WBC 2.7*  NEUTROABS 1.8  HGB 3.4*  HCT 13.9*  MCV 61.8*  PLT [id number]   Basic Metabolic Panel: Recent Labs  Lab 12/23/20 2002  NA 126*  K 4.2  CL 91*  CO2 24  GLUCOSE 107*  BUN 16  CREATININE 0.88  CALCIUM 8.1*   GFR: Estimated Creatinine Clearance: 113 mL/min (by C-G formula based on SCr of 0.88 mg/dL). Liver Function Tests: Recent Labs  Lab 12/23/20 2002  AST 32  ALT 14  ALKPHOS 58  BILITOT 0.8  PROT 6.3*  ALBUMIN 3.3*   No results for input(s): LIPASE, AMYLASE in the last 168 hours. No results for input(s): AMMONIA in the last 168 hours. Coagulation Profile: Recent Labs  Lab 12/23/20 2040   INR 1.1   Cardiac Enzymes: No results for input(s): CKTOTAL, CKMB, CKMBINDEX, TROPONINI in the last 168 hours. BNP (last 3 results) No results for input(s): PROBNP in the last 8760 hours. HbA1C: No results for input(s): HGBA1C in the last 72 hours. CBG: No results for input(s): GLUCAP in the last 168 hours. Lipid Profile: No results for input(s): CHOL, HDL, LDLCALC, TRIG, CHOLHDL, LDLDIRECT in the last 72 hours. Thyroid Function Tests: No results for input(s): TSH, T4TOTAL, FREET4, T3FREE, THYROIDAB in the last 72 hours. Anemia Panel: No results for input(s): VITAMINB12, FOLATE, FERRITIN, TIBC, IRON, RETICCTPCT in the last 72 hours. Urine analysis: No results found  for: COLORURINE, APPEARANCEUR, LABSPEC, PHURINE, GLUCOSEU, HGBUR, BILIRUBINUR, KETONESUR, PROTEINUR, UROBILINOGEN, NITRITE, LEUKOCYTESUR  Radiological Exams on Admission: CT Angio Chest PE W/Cm &/Or Wo Cm  Result Date: 12/23/2020 CLINICAL DATA:  Cough, COVID EXAM: CT ANGIOGRAPHY CHEST WITH CONTRAST TECHNIQUE: Multidetector CT imaging of the chest was performed using the standard protocol during bolus administration of intravenous contrast. Multiplanar CT image reconstructions and MIPs were obtained to evaluate the vascular anatomy. CONTRAST:  172mL OMNIPAQUE IOHEXOL 350 MG/ML SOLN COMPARISON:  None. FINDINGS: Cardiovascular: Study severely limited due to respiratory motion, coughing, despite repeating the study. No visible large or central pulmonary emboli. Heart is normal size. Aorta normal caliber. Mediastinum/Nodes: No mediastinal, hilar, or axillary adenopathy. Trachea and esophagus are unremarkable. Thyroid unremarkable. Lungs/Pleura: Extensive bilateral airspace disease compatible with COVID pneumonia. No effusions. Opacities most confluent in the lower lobes. Upper Abdomen: Imaging into the upper abdomen demonstrates no acute findings. Musculoskeletal: Chest Pung soft tissues are unremarkable. No acute bony abnormality.  Review of the MIP images confirms the above findings. IMPRESSION: Severely limited study due to respiratory motion and coughing throughout the study. No visible large or central pulmonary emboli. Extensive bilateral airspace disease compatible with COVID pneumonia. Electronically Signed   By: Rolm Baptise M.D.   On: 12/23/2020 23:55   DG Chest Port 1 View  Result Date: 12/23/2020 CLINICAL DATA:  Shortness of breath. Patient diagnosed with COVID-19 4 days ago. EXAM: PORTABLE CHEST 1 VIEW COMPARISON:  None. FINDINGS: Bilateral pulmonary infiltrates are identified. The heart is largely obscured by adjacent infiltrates. The hila and mediastinum are unremarkable. No pneumothorax. IMPRESSION: Significant bibasilar pulmonary infiltrates, likely due to COVID-19 pneumonia given history. Electronically Signed   By: Dorise Bullion III M.D   On: 12/23/2020 20:06    EKG: Independently reviewed.  Sinus tachycardia, nonspecific T wave abnormality.  QTc 480.  Assessment/Plan Principal Problem:   Pneumonia due to COVID-19 virus Active Problems:   Acute hypoxemic respiratory failure (HCC)   Sepsis (HCC)   Symptomatic anemia   Gastroenteritis due to COVID-19 virus   Acute hypoxemic respiratory failure and sepsis secondary to severe COVID-19 viral pneumonia: Patient is not vaccinated against COVID.  Meets criteria for sepsis  -2 SIRS (tachycardia, tachypnea) and SARS-CoV-2 PCR test positive.  No lactic acidosis or hypotension to suggest severe sepsis.  WBC 2.7 with low absolute lymphocyte count.  D-dimer 1.39.  Chest x-ray personally reviewed showing significant bibasilar pulmonary infiltrates. CT angiogram chest (severely limited study due to respiratory motion and coughing) showing no visible large or central PE.  Showing extensive bilateral airspace disease compatible with COVID-19 pneumonia. SPO2 71% on room air, currently requiring 6 L supplemental oxygen to maintain sats in the mid 90s.  Tachypneic with  respiratory rate up to 30. -Remdesivir -IV Solu-Medrol 0.5 mg/kg every 12 hours -Discussed FDA's emergency use authorization of baricitinib for the treatment of Covid infection in hospitalized patients requiring supplemental oxygen.  Patient denies history of hepatitis, HIV, malignancy, or active TB.  Risks versus benefits discussed with the patient and she wishes to proceed with using baricitinib to treat her illness.  Start baricitinib 4 mg daily. -Tachycardia resolved after fluid bolus in the ED, no hypotension. -Vitamin C, zinc, vitamin D -Antitussives as needed -Tylenol as needed -Scheduled bronchodilator -Check remainder of inflammatory markers including ferritin, fibrinogen, CRP, LDH -Check procalcitonin level -Daily CBC with differential, CMP, CRP, D-dimer -Airborne and contact precautions -Incentive spirometry, flutter valve -Encourage prone positioning -Continuous pulse ox -Supplemental oxygen as needed to keep oxygen  saturation above 90% -Blood culture x2 pending -Bilateral lower extremity Dopplers ordered to rule out DVT -Encourage mobility-PT/OT  Symptomatic severe microcytic anemia: Hemoglobin 3.4, hematocrit 13.9, MCV 61.8.  No prior labs for comparison.  Not endorsing any symptoms of GI bleed.  FOBT negative.  Reports diarrhea likely related to menorrhagia.  Patient reports having chronic heavy menstrual cycles for which she has never been evaluated by a gynecologist.  Does report being told she is anemic after she had lab work done 2 years ago but has never been treated for her anemia.  No known history of thalassemia. -Type and screen, 2 units PRBCs ordered in the ED. Will order additional 2 units PRBCs.  Follow-up posttransfusion H&H.  Anemia panel ordered.  Pelvic ultrasound ordered to assess for possible fibroids.  Will need outpatient gynecology referral.   Chest pain, mild troponin elevation: Chest pain appears atypical, ongoing for a week.  CT angiogram negative for  large or central PE.  High-sensitivity troponin mildly elevated but stable (26> 24). EKG not suggestive of ACS.  Suspect mild troponin elevation as due to demand ischemia from severe anemia and COVID infection.  Patient is currently chest pain-free. -Cardiac monitoring, continue to monitor  COVID-19 viral gastroenteritis: Complaining of vomiting and diarrhea.  Abdominal exam benign.  LFTs normal.  No recent antibiotic use.  No leukocytosis. -Also check lipase level although suspicion very low for acute pancreatitis.  Compazine as needed for nausea/vomiting (avoid Zofran given borderline QT prolongation on EKG).  Loperamide as needed for diarrhea/loose stools.  Hyponatremia: Likely due to poor oral intake, vomiting, and diarrhea in the setting of acute viral illness.  Sodium 126, no prior labs for comparison. -IV fluid hydration.  Check serum osmolarity.  Continue to monitor sodium level closely.  Hypochloremia: Likely due to vomiting.  Chloride 91. -IV fluid hydration and continue to monitor  Borderline QT prolongation on EKG -Cardiac monitoring.  Keep potassium above 4 and magnesium above 2.  Obesity (BMI 37.28) -Encourage lifestyle modifications -exercise, healthy eating, and weight loss after patient recovers from her acute viral illness.  DVT prophylaxis: Lovenox given positive D-dimer and increased risk of thromboembolism with COVID-19 viral infection. Addendum: No doses of Lovenox given so far: Will hold chemical DVT prophylaxis until patient finishes her blood transfusions and repeat labs show stable H&H.  Will hold mechanical DVT prophylaxis until Dopplers rule out DVT. Code Status: Full code Family Communication: No family available at this time. Disposition Plan: Status is: Inpatient  Remains inpatient appropriate because:IV treatments appropriate due to intensity of illness or inability to take PO, Inpatient level of care appropriate due to severity of illness and Acute hypoxemic  respiratory failure secondary to severe COVID-19 viral pneumonia   Dispo: The patient is from: Home              Anticipated d/c is to: Home              Anticipated d/c date is: > 3 days              Patient currently is not medically stable to d/c.  The medical decision making on this patient was of high complexity and the patient is at high risk for clinical deterioration, therefore this is a level 3 visit.  Shela Leff MD [facility]  If 7PM-7AM, please contact night-coverage [email or web address]  12/24/2020, 2:13 AM

## 2020-12-24 NOTE — Progress Notes (Signed)
Bilateral lower extremity venous study completed.      Please see CV Proc for preliminary results.   Kamalei Roeder, RVT  

## 2020-12-25 DIAGNOSIS — A0839 Other viral enteritis: Secondary | ICD-10-CM

## 2020-12-25 DIAGNOSIS — J9601 Acute respiratory failure with hypoxia: Secondary | ICD-10-CM

## 2020-12-25 LAB — CBC WITH DIFFERENTIAL/PLATELET
Abs Immature Granulocytes: 0.01 10*3/uL (ref 0.00–0.07)
Basophils Absolute: 0 10*3/uL (ref 0.0–0.1)
Basophils Relative: 0 %
Eosinophils Absolute: 0 10*3/uL (ref 0.0–0.5)
Eosinophils Relative: 0 %
HCT: 26.5 % — ABNORMAL LOW (ref 36.0–46.0)
Hemoglobin: 7.3 g/dL — ABNORMAL LOW (ref 12.0–15.0)
Immature Granulocytes: 1 %
Lymphocytes Relative: 42 %
Lymphs Abs: 0.8 10*3/uL (ref 0.7–4.0)
MCH: 21.2 pg — ABNORMAL LOW (ref 26.0–34.0)
MCHC: 27.5 g/dL — ABNORMAL LOW (ref 30.0–36.0)
MCV: 76.8 fL — ABNORMAL LOW (ref 80.0–100.0)
Monocytes Absolute: 0.2 10*3/uL (ref 0.1–1.0)
Monocytes Relative: 11 %
Neutro Abs: 0.9 10*3/uL — ABNORMAL LOW (ref 1.7–7.7)
Neutrophils Relative %: 46 %
Platelets: 238 10*3/uL (ref 150–400)
RBC: 3.45 MIL/uL — ABNORMAL LOW (ref 3.87–5.11)
RDW: 28.1 % — ABNORMAL HIGH (ref 11.5–15.5)
WBC: 1.9 10*3/uL — ABNORMAL LOW (ref 4.0–10.5)
nRBC: 11.1 % — ABNORMAL HIGH (ref 0.0–0.2)

## 2020-12-25 LAB — COMPREHENSIVE METABOLIC PANEL
ALT: 12 U/L (ref 0–44)
AST: 22 U/L (ref 15–41)
Albumin: 3.3 g/dL — ABNORMAL LOW (ref 3.5–5.0)
Alkaline Phosphatase: 54 U/L (ref 38–126)
Anion gap: 11 (ref 5–15)
BUN: 16 mg/dL (ref 6–20)
CO2: 25 mmol/L (ref 22–32)
Calcium: 8.5 mg/dL — ABNORMAL LOW (ref 8.9–10.3)
Chloride: 100 mmol/L (ref 98–111)
Creatinine, Ser: 0.71 mg/dL (ref 0.44–1.00)
GFR, Estimated: >60 mL/min (ref >60)
Glucose, Bld: 172 mg/dL — ABNORMAL HIGH (ref 70–99)
Potassium: 4.1 mmol/L (ref 3.5–5.1)
Sodium: 136 mmol/L (ref 135–145)
Total Bilirubin: 0.6 mg/dL (ref 0.3–1.2)
Total Protein: 6.3 g/dL — ABNORMAL LOW (ref 6.5–8.1)

## 2020-12-25 LAB — HEMOGLOBIN A1C
Hgb A1c MFr Bld: 5.2 % (ref 4.8–5.6)
Mean Plasma Glucose: 102.54 mg/dL

## 2020-12-25 LAB — GLUCOSE, CAPILLARY
Glucose-Capillary: 159 mg/dL — ABNORMAL HIGH (ref 70–99)
Glucose-Capillary: 134 mg/dL — ABNORMAL HIGH (ref 70–99)
Glucose-Capillary: 169 mg/dL — ABNORMAL HIGH (ref 70–99)
Glucose-Capillary: 131 mg/dL — ABNORMAL HIGH (ref 70–99)

## 2020-12-25 LAB — C-REACTIVE PROTEIN: CRP: 2.8 mg/dL — ABNORMAL HIGH (ref <1.0)

## 2020-12-25 LAB — D-DIMER, QUANTITATIVE: D-Dimer, Quant: 1.89 ug/mL-FEU — ABNORMAL HIGH (ref 0.00–0.50)

## 2020-12-25 MED ORDER — INSULIN ASPART 100 UNIT/ML ~~LOC~~ SOLN: 0.0000 [IU] | Freq: Three times a day (TID) | SUBCUTANEOUS | Status: DC

## 2020-12-25 MED ORDER — INSULIN ASPART 100 UNIT/ML ~~LOC~~ SOLN
0.0000 [IU] | Freq: Three times a day (TID) | SUBCUTANEOUS | Status: DC
  Administered 2020-12-25: 1 [IU] via SUBCUTANEOUS
  Administered 2020-12-25 (×2): 2 [IU] via SUBCUTANEOUS

## 2020-12-25 MED ORDER — INSULIN ASPART 100 UNIT/ML ~~LOC~~ SOLN: 0.0000 [IU] | Freq: Every day | SUBCUTANEOUS | Status: DC

## 2020-12-25 NOTE — TOC Progression Note (Signed)
Transition of Care [facility]) - Progression Note    Patient Details  Name: Caitlyn King MRN: [id number] Date of Birth: Jul 05, 1986  Transition of [facility]) CM/SW Contact  Purcell Mouton, RN Phone Number: 12/25/2020, 11:21 AM  Clinical Narrative:    TOC will continue to follow for discharge needs.   Expected Discharge Plan: Home/Self Care Barriers to Discharge: No Barriers Identified  Expected Discharge Plan and Services Expected Discharge Plan: Home/Self Care       Living arrangements for the past 2 months: Single Family Home                                       Social Determinants of Health (SDOH) Interventions    Readmission Risk Interventions No flowsheet data found.

## 2020-12-25 NOTE — Evaluation (Signed)
Occupational Therapy Evaluation Patient Details Name: Caitlyn King MRN: [id number] DOB: 1986-04-03 Today's Date: 12/25/2020    History of Present Illness Caitlyn King is a 35 y.o. female with medical history significant of difficulty hearing, obesity .She had a positive outpatient Covid test on 12/19/2020.  She was seen in the ED on 1/6 for cough and shortness of breath.  She was discharged with referral for monoclonal antibody infusion.  Presenting to the ED1/9/22  with complaints of worsening shortness of breath and fatigue. CT angiogram chest  showing no visible large or central PE.  Showing extensive bilateral airspace disease compatible with COVID-19 pneumonia   Clinical Impression   Patient currently living with her mother in a house with 3 STE, typically is I at baseline with self care. Patient is supervision to min G assist for ADLs and functional ambulation due to x1 loss of balance due to LE buckling "my legs hurt." Patient reports LEs feeling stiff from being in bed. OT educate patient on LE exercises, importance of OOB mobility including ambulating to bathroom vs sole use of pure wick in order to maximize activity tolerance and global strength necessary for ADLs. Patient does desat to mid 80s on 4L with activity, cued for PLB. Recommend continued acute OT services to maximize patient activity tolerance  in order to facilitate D/C to venue listed below.     Follow Up Recommendations  Supervision/Assistance - 24 hour (initially)    Equipment Recommendations  Tub/shower seat       Precautions / Restrictions Precautions Precautions: Fall Precaution Comments: moniltor sats/HR Restrictions Weight Bearing Restrictions: No      Mobility Bed Mobility Overal bed mobility: Modified Independent                  Transfers Overall transfer level: Needs assistance Equipment used: None Transfers: Sit to/from Stand Sit to Stand: Supervision;Min guard         General transfer  comment: supervision for initial sit to stand however patient is unsteady with dynamic mobility requiring min G for safety    Balance Overall balance assessment: Mild deficits observed, not formally tested                                         ADL either performed or assessed with clinical judgement   ADL Overall ADL's : Needs assistance/impaired Eating/Feeding: Independent   Grooming: Supervision/safety;Standing   Upper Body Bathing: Set up;Sitting   Lower Body Bathing: Supervison/ safety;Sit to/from stand;Sitting/lateral leans   Upper Body Dressing : Set up;Sitting   Lower Body Dressing: Set up;Supervision/safety;Sitting/lateral leans;Sit to/from stand Lower Body Dressing Details (indicate cue type and reason): seated patient able to perform figure 4 to doff/don socks Toilet Transfer: Min guard;Ambulation Toilet Transfer Details (indicate cue type and reason): patient ambulate in room, has x1 episode of LE buckling "my legs hurt" when asked to describe patient states her legs feel stiff from not moving around [place] and Hygiene: Sit to/from stand;Supervision/safety Toileting - Clothing Manipulation Details (indicate cue type and reason): to wash peri area in standing at sink side     Functional mobility during ADLs: Min guard General ADL Comments: educated patient on importance of OOB activity, patient asking to keep purewick as she has some incontinence when she coughs. OT strongly encourage patient to get up to void when bladder is feeling full vs solely relying on  purewick. also encouraged patient to sit up at EOB for all meals/throughout the day as she does not have a recliner in her room (notified CNA). Patient desat to mid 61s on 4L with activity, returned pt to 6L at end of session     Vision Baseline Vision/History: Wears glasses Wears Glasses: At all times              Pertinent Vitals/Pain Pain Assessment: Faces Faces  Pain Scale: Hurts little more Pain Location: LEs Pain Descriptors / Indicators:  (stiff) Pain Intervention(s): Monitored during session     Hand Dominance Right   Extremity/Trunk Assessment Upper Extremity Assessment Upper Extremity Assessment: Overall WFL for tasks assessed   Lower Extremity Assessment Lower Extremity Assessment: Defer to PT evaluation       Communication Communication Communication: No difficulties   Cognition Arousal/Alertness: Awake/alert Behavior During Therapy: Flat affect Overall Cognitive Status: Within Functional Limits for tasks assessed                                 General Comments: does appear to lack initiation of tasks at times despite instruction. set up to brush teeth and wash face/removed patient's face mask for her at sink however patient washed peri area first and then did not initiate any further g/h      Exercises Exercises: Other exercises Other Exercises Other Exercises: educated patient on general LE exercises at seated and bed level in order to minimize stiffness and maximize patient activity tolerance necessary for participation in [place] expects to be discharged to:: Private residence Living Arrangements: Parent Available Help at Discharge: Family;Available 24 hours/day Type of Home: House Home Access: Stairs to enter [employer] of Steps: 3   Home Layout: One level               Home Equipment: None          Prior Functioning/Environment Level of Independence: Independent                 OT Problem List: Decreased activity tolerance;Impaired balance (sitting and/or standing);Decreased safety awareness;Cardiopulmonary status limiting activity;Obesity      OT Treatment/Interventions: Self-care/ADL training;Therapeutic exercise;Energy conservation;DME and/or AE instruction;Therapeutic activities;Patient/family education;Balance training    OT  Goals(Current goals can be found in the care plan section) Acute Rehab OT Goals Patient Stated Goal: to go home OT Goal Formulation: With patient Time For Goal Achievement: 01/08/21 Potential to Achieve Goals: Good  OT Frequency: Min 2X/week    AM-PAC OT "6 Clicks" Daily Activity     Outcome Measure Help from another person eating meals?: None Help from another person taking care of personal grooming?: A Little Help from another person toileting, which includes using toliet, bedpan, or urinal?: A Little Help from another person bathing (including washing, rinsing, drying)?: A Little Help from another person to put on and taking off regular upper body clothing?: A Little Help from another person to put on and taking off regular lower body clothing?: A Little 6 Click Score: 19   End of Session Equipment Utilized During Treatment: Oxygen Nurse Communication: Mobility status  Activity Tolerance: Patient tolerated treatment well Patient left: Other (comment);with call bell/phone within reach (EOB)  OT Visit Diagnosis: Other abnormalities of gait and mobility (R26.89)                Time: 0929-1000 OT Time Calculation (  min): 31 min Charges:  OT General Charges $OT Visit: 1 Visit OT Evaluation $OT Eval Low Complexity: 1 Low OT Treatments $Self Care/Home Management : 8-22 mins  Caitlyn King OT OT pager: [phone]  Caitlyn King 12/25/2020, 10:49 AM

## 2020-12-25 NOTE — Plan of Care (Signed)
  Problem: Education: Goal: Knowledge of General Education information will improve Description: Including pain rating scale, medication(s)/side effects and non-pharmacologic comfort measures Outcome: Progressing   Problem: Clinical Measurements: Goal: Will remain free from infection Outcome: Progressing Goal: Respiratory complications will improve Outcome: Progressing   Problem: Activity: Goal: Risk for activity intolerance will decrease Outcome: Progressing   Problem: Coping: Goal: Level of anxiety will decrease Outcome: Progressing   Problem: Pain Managment: Goal: General experience of comfort will improve Outcome: Progressing   Problem: Safety: Goal: Ability to remain free from injury will improve Outcome: Progressing   Problem: Skin Integrity: Goal: Risk for impaired skin integrity will decrease Outcome: Progressing   

## 2020-12-25 NOTE — Progress Notes (Addendum)
PROGRESS NOTE  ELZABETH MINTEER  [id number] DOB: 10-23-1986 DOA: 12/23/2020 PCP: Patient, No Pcp Per  Brief Narrative: Caitlyn King is a 35 y.o. female with a history of obesity, HOH, menorrhagia, covid-19 diagnosed 1/5, initially presented to ED 1/6 with shortness of breath but was not hypoxic and discharged home with referral for monoclonal antibody, and returned to the ED 1/9 with worsening symptoms, namely fatigue and dyspnea. Hgb found to be 3.4g/dl in setting of menorrhagia and workup also positive for covid pneumonia with hypoxia. 4u PRBCs were given with improvement in anemia. Remdesivir, solumedrol, and baricitinib are given for covid-19 pneumonia.   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  - Suspect a chronic aspect to anemia due to stable vital signs on presentation. FOBT negative. +fibroids on U/S. Will need GYN follow up, consideration of suppression.  - s/p 4u PRBCs with hgb up to 8.2g/dl and now trending back down in the absence of ongoing bleeding. Will recheck CBC.  - Supplement folic acid - Consider IV iron before discharge. Note negative reticulocyte count.  Acute hypoxic respiratory failure due to covid-19 pneumonia: SARS-CoV-2 positive on 1/5.  - Wean oxygen as tolerated. - Continue remdesivir (started 1/10) - Continue steroids due to hypoxemia and negative FOBT - Continue baricitinib - Continue empiric abx with dense consolidations on CT.  - Encourage OOB, IS, FV, and awake proning if able - Continue airborne, contact precautions for 21 days from  positive testing. - Monitor CMP and inflammatory markers - Enoxaparin prophylactic dose.   Steroid-induced hyperglycemia: On AM labs.  - Start SSI, check HbA1c.  Troponin elevation: Mild, downtrending without anginal features.  - Consider risk stratification after acute illness.   Obesity: Estimated body mass index is 37.28 kg/m as calculated from the following:   Height as of 12/20/20: 5\' 7"  (1.702 m).   Weight as of 12/20/20: 108 kg.  DVT prophylaxis: SCDs, will initiate anticoagulation in setting of covid infection once hgb stable (still trending downward) Code Status: Full Family Communication: None at bedside Disposition Plan:  Status is: Inpatient  Remains inpatient appropriate because:Inpatient level of care appropriate due to severity of illness  Dispo: The patient is from: Home              Anticipated d/c is to: Home              Anticipated d/c date is: 3 days              Patient currently is not medically stable to d/c.  Consultants:   None  Procedures:   None  Antimicrobials:  Remdesivir   Subjective: Dyspnea on exertion is moderate, slightly improved. No chest pain. No bleeding. Working with therapy and tires much more quickly than baseline with exertion.  Objective: Vitals:   12/24/20 2005 12/25/20 0035 12/25/20 0614 12/25/20 1311  BP: (!) 111/56 108/60 113/73 (!) 114/54  Pulse: 77 73 73 64  Resp: 16 (!) 22 (!) 22 13  Temp: 98.8 F (37.1 C) 98.6 F (37 C) 98.3 F (36.8 C) 97.9 F (36.6 C)  TempSrc: Oral Oral Oral Oral  SpO2: 98% 94% 95% 95%    Intake/Output Summary (Last 24 hours) at 12/25/2020 1619 Last data filed at 12/25/2020 0600 Gross per 24 hour  Intake 480 ml  Output 1890 ml  Net -1410 ml   There were no vitals filed for this visit.  Gen: 35 y.o. female in no distress Pulm: Non-labored breathing supplemental oxygen with diminished crackles bilaterally without wheezes.  CV: Regular rate and rhythm. No murmur, rub, or gallop. No  JVD, no pitting pedal edema. GI: Abdomen soft, non-tender, non-distended, with normoactive bowel sounds. No organomegaly or masses felt. Ext: Warm, no deformities Skin: No rashes, lesions or ulcers Neuro: Alert and oriented. No focal neurological deficits. Psych: Judgement and insight appear normal. Mood & affect appropriate.   Data Reviewed: I have personally reviewed following labs and imaging studies  CBC: Recent Labs  Lab 12/23/20 2002 12/24/20 0900 12/24/20 1900 12/25/20 0451  WBC 2.7*  --   --  1.9*  NEUTROABS 1.8  --   --  0.9*  HGB 3.4* 6.9* 8.2* 7.3*  HCT 13.9* 24.3* 28.9* 26.5*  MCV 61.8*  --   --  76.8*  PLT 313  --   --  99991111   Basic Metabolic Panel: Recent Labs  Lab 12/23/20 2002 12/24/20 0900 12/25/20 0451  NA 126*  --  136  K 4.2  --  4.1  CL 91*  --  100  CO2 24  --  25  GLUCOSE 107*  --  172*  BUN 16  --  16  CREATININE 0.88  --  0.71  CALCIUM 8.1*  --  8.5*  MG  --  2.2  --    GFR: Estimated Creatinine Clearance: 124.3 mL/min (by C-G formula based on SCr of 0.71 mg/dL). Liver Function Tests: Recent Labs  Lab 12/23/20 2002 12/25/20 0451  AST 32 22  ALT 14 12  ALKPHOS 58 54  BILITOT 0.8 0.6  PROT 6.3* 6.3*  ALBUMIN 3.3* 3.3*   Recent Labs  Lab 12/24/20 0900  LIPASE 25   No results for input(s): AMMONIA in the last 168 hours. Coagulation Profile: Recent Labs  Lab 12/23/20 2040  INR 1.1   Cardiac Enzymes: No results for input(s): CKTOTAL, CKMB, CKMBINDEX, TROPONINI in the last 168 hours. BNP (last 3 results) No results for input(s): PROBNP in the last 8760 hours. HbA1C: Recent Labs    12/25/20 0451  HGBA1C 5.2   CBG: Recent Labs  Lab 12/25/20 0811 12/25/20 1111 12/25/20 1558  GLUCAP 159* 134* 169*   Lipid Profile: Recent Labs    12/24/20 0900  TRIG 128   Thyroid Function Tests: No results for input(s): TSH, T4TOTAL, FREET4, T3FREE, THYROIDAB in the last 72 hours. Anemia Panel: Recent Labs    12/24/20 0900   VITAMINB12 219  FOLATE 4.2*  FERRITIN 11  TIBC 353  IRON 13*  RETICCTPCT <0.4*   Urine analysis: No results found for: COLORURINE, APPEARANCEUR, LABSPEC, PHURINE, GLUCOSEU, HGBUR, BILIRUBINUR, KETONESUR, PROTEINUR, UROBILINOGEN, NITRITE, LEUKOCYTESUR Recent Results (from the past 240 hour(s))  Culture, blood (single)     Status: None (Preliminary result)   Collection Time: 12/23/20  8:16 PM   Specimen: BLOOD  Result Value Ref Range Status   Specimen Description   Final    BLOOD RIGHT ANTECUBITAL Performed at [facility] [address], [place], [place] [address]    Special Requests   Final    BOTTLES DRAWN AEROBIC AND ANAEROBIC Blood Culture adequate volume Performed at [facility], [place] [address], [place], [place] [address]    Culture   Final    NO GROWTH 2 DAYS Performed at [facility] [address], [place], [place] [address]    Report Status PENDING  Incomplete  Resp Panel by RT-PCR (Flu A&B, Covid) Nasopharyngeal Swab     Status: Abnormal   Collection Time: 12/23/20  9:06 PM   Specimen: Nasopharyngeal Swab; Nasopharyngeal(NP) swabs in vial transport medium  Result Value Ref Range Status   SARS Coronavirus 2 by RT PCR POSITIVE (A) NEGATIVE Final    Comment: CRITICAL RESULT CALLED TO, READ BACK BY AND VERIFIED WITH: JERRY DAVIDSON RN 12/23/2020 @2310  [place] [facility] (NOTE) SARS-CoV-2 target nucleic acids are DETECTED.  The SARS-CoV-2 RNA is generally detectable in upper respiratory specimens during the acute phase of infection. Positive results are indicative of the presence of the identified virus, but do not rule out bacterial infection or co-infection with other pathogens not detected by the test. Clinical correlation with patient history and other diagnostic information is necessary to determine patient infection status. The expected result is Negative.  Fact Sheet for  Patients: EntrepreneurPulse.com.au  Fact Sheet for Healthcare Providers: IncredibleEmployment.be  This test is not yet approved or cleared by the [place] FDA and  has been authorized for detection and/or diagnosis of SARS-CoV-2 by FDA under an Emergency Use Authorization (EUA).  This EUA will remain in effect  (meaning this test can be used) for the duration of  the COVID-19 declaration under Section 564(b)(1) of the Act, 21 U.S.C. section 360bbb-3(b)(1), unless the authorization is terminated or revoked sooner.     Influenza A by PCR NEGATIVE NEGATIVE Final   Influenza B by PCR NEGATIVE NEGATIVE Final    Comment: (NOTE) The Xpert Xpress SARS-CoV-2/FLU/RSV plus assay is intended as an aid in the diagnosis of influenza from Nasopharyngeal swab specimens and should not be used as a sole basis for treatment. Nasal washings and aspirates are unacceptable for Xpert Xpress SARS-CoV-2/FLU/RSV testing.  Fact Sheet for Patients: EntrepreneurPulse.com.au  Fact Sheet for Healthcare Providers: IncredibleEmployment.be  This test is not yet approved or cleared by the [place] FDA and has been authorized for detection and/or diagnosis of SARS-CoV-2 by FDA under an Emergency Use Authorization (EUA). This EUA will remain in effect (meaning this test can be used) for the duration of the COVID-19 declaration under Section 564(b)(1) of the Act, 21 U.S.C. section 360bbb-3(b)(1), unless the authorization is terminated or revoked.  Performed at [facility], [place] [address] [place], [place] [address]       Radiology Studies: CT Angio Chest PE W/Cm &/Or Wo Cm  Result Date: 12/23/2020 CLINICAL DATA:  Cough, COVID EXAM: CT ANGIOGRAPHY CHEST WITH CONTRAST TECHNIQUE: Multidetector CT imaging of the chest was performed using the standard protocol during bolus administration of intravenous contrast.  Multiplanar CT image reconstructions and MIPs were obtained to evaluate the vascular anatomy. CONTRAST:  164mL OMNIPAQUE IOHEXOL 350 MG/ML SOLN COMPARISON:  None. FINDINGS: Cardiovascular: Study severely limited due to respiratory motion, coughing, despite repeating the study. No visible large or central pulmonary emboli. Heart is normal size. Aorta normal caliber. Mediastinum/Nodes: No mediastinal, hilar, or axillary adenopathy. Trachea and esophagus are unremarkable. Thyroid unremarkable. Lungs/Pleura: Extensive bilateral airspace disease compatible with COVID pneumonia. No effusions. Opacities most confluent in the lower lobes. Upper Abdomen: Imaging into the upper abdomen demonstrates no acute findings. Musculoskeletal: Chest Boucher soft tissues are unremarkable. No acute bony abnormality. Review of the  MIP images confirms the above findings. IMPRESSION: Severely limited study due to respiratory motion and coughing throughout the study. No visible large or central pulmonary emboli. Extensive bilateral airspace disease compatible with COVID pneumonia. Electronically Signed   By: Rolm Baptise M.D.   On: 12/23/2020 23:55   DG Chest Port 1 View  Result Date: 12/23/2020 CLINICAL DATA:  Shortness of breath. Patient diagnosed with COVID-19 4 days ago. EXAM: PORTABLE CHEST 1 VIEW COMPARISON:  None. FINDINGS: Bilateral pulmonary infiltrates are identified. The heart is largely obscured by adjacent infiltrates. The hila and mediastinum are unremarkable. No pneumothorax. IMPRESSION: Significant bibasilar pulmonary infiltrates, likely due to COVID-19 pneumonia given history. Electronically Signed   By: Dorise Bullion III M.D   On: 12/23/2020 20:06   US PELVIC COMPLETE WITH TRANSVAGINAL  Result Date: 12/24/2020 CLINICAL DATA:  Initial evaluation for menorrhagia. EXAM: TRANSABDOMINAL AND TRANSVAGINAL ULTRASOUND OF PELVIS TECHNIQUE: Both transabdominal and transvaginal ultrasound examinations of the pelvis were  performed. Transabdominal technique was performed for global imaging of the pelvis including uterus, ovaries, adnexal regions, and pelvic cul-de-sac. It was necessary to proceed with endovaginal exam following the transabdominal exam to visualize the uterus, endometrium, and ovaries. COMPARISON:  None FINDINGS: Uterus Measurements: 17.3 x 11.6 x 13.0 cm = volume: 1371.5 mL. Multiple uterine fibroids are seen involving the uterus. 5.2 x 5.1 x 4.7 cm intramural fibroid present at the right uterine fundus. 5.2 x 5.3 x 4.3 cm intramural fibroid present at the mid uterine body. 6.1 x 6.0 x 5.5 cm intramural fibroid present at the left uterine fundus. Endometrium Not visualized or assessed due to overlying fibroids. Right ovary Not visualized.  No adnexal mass. Left ovary Measurements: 4.8 x 2.7 x 4.2 cm = volume: 28.7 mL. 2.4 x 2.2 x 3.2 cm simple cyst, likely a functional/physiologic follicular cyst. No internal complexity, vascularity, or solid nodularity. Other findings Trace free fluid within the pelvis, presumably physiologic. IMPRESSION: 1. Enlarged fibroid uterus as above. 2. Endometrial stripe not visualized or assessed due to overlying fibroids. If there is continued clinical concern for a possible underlying occult endometrial pathology, then further evaluation with dedicated sonohysterography could be performed for further evaluation as warranted. 3. 3.2 cm simple right ovarian cyst, likely a normal physiologic follicular cyst. No follow up imaging recommended. Note: This recommendation does not apply to premenarchal patients or to those with increased risk (genetic, family history, elevated tumor markers or other high-risk factors) of ovarian cancer. Reference: Radiology 2019 Nov; 293(2):359-371. Electronically Signed   By: Jeannine Boga M.D.   On: 12/24/2020 03:44   VAS [place] LOWER EXTREMITY VENOUS (DVT)  Result Date: 12/24/2020  Lower Venous DVT Study Indications: D-Dimer.  Risk Factors: None  identified. Comparison Study: No previous exams Performing Technologist: Vonzell Schlatter RVT  Examination Guidelines: A complete evaluation includes B-mode imaging, spectral Doppler, color Doppler, and power Doppler as needed of all accessible portions of each vessel. Bilateral testing is considered an integral part of a complete examination. Limited examinations for reoccurring indications may be performed as noted. The reflux portion of the exam is performed with the patient in reverse Trendelenburg.  +---------+---------------+---------+-----------+----------+--------------+ RIGHT    CompressibilityPhasicitySpontaneityPropertiesThrombus Aging +---------+---------------+---------+-----------+----------+--------------+ CFV      Full           Yes      Yes                                 +---------+---------------+---------+-----------+----------+--------------+  SFJ      Full                                                        +---------+---------------+---------+-----------+----------+--------------+ FV Prox  Full                                                        +---------+---------------+---------+-----------+----------+--------------+ FV Mid   Full                                                        +---------+---------------+---------+-----------+----------+--------------+ FV DistalFull                                                        +---------+---------------+---------+-----------+----------+--------------+ PFV      Full                                                        +---------+---------------+---------+-----------+----------+--------------+ POP      Full           Yes      Yes                                 +---------+---------------+---------+-----------+----------+--------------+ PTV      Full                                                        +---------+---------------+---------+-----------+----------+--------------+  PERO     Full                                                        +---------+---------------+---------+-----------+----------+--------------+   +---------+---------------+---------+-----------+----------+--------------+ LEFT     CompressibilityPhasicitySpontaneityPropertiesThrombus Aging +---------+---------------+---------+-----------+----------+--------------+ CFV      Full           Yes      Yes                                 +---------+---------------+---------+-----------+----------+--------------+ SFJ      Full                                                        +---------+---------------+---------+-----------+----------+--------------+  FV Prox  Full                                                        +---------+---------------+---------+-----------+----------+--------------+ FV Mid   Full                                                        +---------+---------------+---------+-----------+----------+--------------+ FV DistalFull                                                        +---------+---------------+---------+-----------+----------+--------------+ PFV      Full                                                        +---------+---------------+---------+-----------+----------+--------------+ POP      Full           Yes      Yes                                 +---------+---------------+---------+-----------+----------+--------------+ PTV      Full                                                        +---------+---------------+---------+-----------+----------+--------------+ PERO     Full                                                        +---------+---------------+---------+-----------+----------+--------------+     Summary: BILATERAL: - No evidence of deep vein thrombosis seen in the lower extremities, bilaterally. -No evidence of popliteal cyst, bilaterally.   *See table(s) above for measurements and  observations. Electronically signed by Jamelle Haring on 12/24/2020 at 7:07:19 PM.    Final     Scheduled Meds: . albuterol  2 puff Inhalation Q6H  . vitamin C  500 mg Oral Daily  . baricitinib  4 mg Oral Daily  . cholecalciferol  1,000 Units Oral Daily  . folic acid  1 mg Oral Daily  . insulin aspart  0-5 Units Subcutaneous QHS  . insulin aspart  0-9 Units Subcutaneous TID WC  . methylPREDNISolone (SOLU-MEDROL) injection  0.5 mg/kg Intravenous Q12H   Followed by  . [START ON 12/27/2020] predniSONE  50 mg Oral Daily  . zinc sulfate  220 mg Oral Daily   Continuous Infusions: . azithromycin 500 mg (12/24/20 2132)  . cefTRIAXone (ROCEPHIN)  IV 2 g (12/24/20 2017)  . remdesivir 100 mg in NS  100 mL 100 mg (12/25/20 1027)     LOS: 1 day   Time spent: 25 minutes.  Patrecia Pour, MD [facility] [email or web address] 12/25/2020, 4:19 PM

## 2020-12-26 LAB — CBC WITH DIFFERENTIAL/PLATELET
Abs Immature Granulocytes: 0.02 10*3/uL (ref 0.00–0.07)
Basophils Absolute: 0 10*3/uL (ref 0.0–0.1)
Basophils Relative: 0 %
Eosinophils Absolute: 0 10*3/uL (ref 0.0–0.5)
Eosinophils Relative: 0 %
HCT: 28.8 % — ABNORMAL LOW (ref 36.0–46.0)
Hemoglobin: 7.9 g/dL — ABNORMAL LOW (ref 12.0–15.0)
Immature Granulocytes: 1 %
Lymphocytes Relative: 25 %
Lymphs Abs: 0.7 10*3/uL (ref 0.7–4.0)
MCH: 21.4 pg — ABNORMAL LOW (ref 26.0–34.0)
MCHC: 27.4 g/dL — ABNORMAL LOW (ref 30.0–36.0)
MCV: 77.8 fL — ABNORMAL LOW (ref 80.0–100.0)
Monocytes Absolute: 0.4 10*3/uL (ref 0.1–1.0)
Monocytes Relative: 14 %
Neutro Abs: 1.7 10*3/uL (ref 1.7–7.7)
Neutrophils Relative %: 60 %
Platelets: 229 10*3/uL (ref 150–400)
RBC: 3.7 MIL/uL — ABNORMAL LOW (ref 3.87–5.11)
RDW: 28.3 % — ABNORMAL HIGH (ref 11.5–15.5)
WBC: 2.9 10*3/uL — ABNORMAL LOW (ref 4.0–10.5)
nRBC: 11.1 % — ABNORMAL HIGH (ref 0.0–0.2)

## 2020-12-26 LAB — COMPREHENSIVE METABOLIC PANEL
ALT: 14 U/L (ref 0–44)
AST: 22 U/L (ref 15–41)
Albumin: 3.5 g/dL (ref 3.5–5.0)
Alkaline Phosphatase: 57 U/L (ref 38–126)
Anion gap: 14 (ref 5–15)
BUN: 17 mg/dL (ref 6–20)
CO2: 25 mmol/L (ref 22–32)
Calcium: 9.2 mg/dL (ref 8.9–10.3)
Chloride: 99 mmol/L (ref 98–111)
Creatinine, Ser: 0.74 mg/dL (ref 0.44–1.00)
GFR, Estimated: >60 mL/min (ref >60)
Glucose, Bld: 132 mg/dL — ABNORMAL HIGH (ref 70–99)
Potassium: 4.3 mmol/L (ref 3.5–5.1)
Sodium: 138 mmol/L (ref 135–145)
Total Bilirubin: 0.4 mg/dL (ref 0.3–1.2)
Total Protein: 6.8 g/dL (ref 6.5–8.1)

## 2020-12-26 LAB — GLUCOSE, CAPILLARY
Glucose-Capillary: 93 mg/dL (ref 70–99)
Glucose-Capillary: 107 mg/dL — ABNORMAL HIGH (ref 70–99)
Glucose-Capillary: 94 mg/dL (ref 70–99)
Glucose-Capillary: 93 mg/dL (ref 70–99)

## 2020-12-26 LAB — HEMOGLOBIN AND HEMATOCRIT, BLOOD
HCT: 29.9 % — ABNORMAL LOW (ref 36.0–46.0)
Hemoglobin: 8.3 g/dL — ABNORMAL LOW (ref 12.0–15.0)

## 2020-12-26 LAB — D-DIMER, QUANTITATIVE: D-Dimer, Quant: 2.81 ug/mL-FEU — ABNORMAL HIGH (ref 0.00–0.50)

## 2020-12-26 LAB — C-REACTIVE PROTEIN: CRP: 1.4 mg/dL — ABNORMAL HIGH (ref <1.0)

## 2020-12-26 MED ORDER — ENOXAPARIN SODIUM 60 MG/0.6ML ~~LOC~~ SOLN
0.5000 mg/kg | SUBCUTANEOUS | Status: DC
  Administered 2020-12-26 – 2020-12-28 (×3): 55 mg via SUBCUTANEOUS
  Filled 2020-12-26 (×4): qty 0.6

## 2020-12-26 MED ORDER — NORETHINDRONE ACETATE 5 MG PO TABS
5.0000 mg | ORAL_TABLET | Freq: Three times a day (TID) | ORAL | Status: DC
  Administered 2020-12-26 – 2020-12-27 (×4): 5 mg via ORAL
  Filled 2020-12-26 (×5): qty 1

## 2020-12-26 NOTE — Plan of Care (Signed)
  Problem: Education: Goal: Knowledge of General Education information will improve Description: Including pain rating scale, medication(s)/side effects and non-pharmacologic comfort measures Outcome: Progressing   Problem: Clinical Measurements: Goal: Ability to maintain clinical measurements within normal limits will improve Outcome: Progressing Goal: Will remain free from infection Outcome: Progressing Goal: Cardiovascular complication will be avoided Outcome: Progressing   Problem: Coping: Goal: Level of anxiety will decrease Outcome: Progressing   

## 2020-12-26 NOTE — Progress Notes (Signed)
PROGRESS NOTE  Caitlyn King  [id number] DOB: [birth year] DOA: 12/23/2020 PCP: Patient, No Pcp Per  Brief Narrative: Caitlyn King is a 35 y.o. female with a history of obesity, HOH, menorrhagia, covid-19 diagnosed 1/5, initially presented to ED 1/6 with shortness of breath but was not hypoxic and discharged home with referral for monoclonal antibody, and returned to the ED 1/9 with worsening symptoms, namely fatigue and dyspnea. Hgb found to be 3.4g/dl in setting of menorrhagia and workup also positive for covid pneumonia with hypoxia. 4u PRBCs were given with improvement in anemia. Remdesivir, solumedrol, and baricitinib are given for covid-19 pneumonia. Pelvic ultrasound confirmed fibroid uterus and menstrual bleeding is now noted. Blood counts are being monitored serially and norethindrone started.  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 FOBT negative. +fibroids on U/S.  - Now having some bleeding. D/w Dr. Si Raider, will start norethindrone 5mg  TID with plans to titrate dose downward to effect for suppression of bleeding. Will need GYN follow up for Bx, etc. - s/p 4u PRBCs with hgb up. Now some bleeding, so recheck H/H with transfusion threshold of 8 g/dl given her symptoms. Would also repeat lasix. - Supplement folic acid - Consider IV iron before discharge. Note negative reticulocyte count.  Acute hypoxic respiratory failure due to covid-19 pneumonia: SARS-CoV-2 positive on 1/5.  - Wean oxygen as tolerated. Remains  hypoxemic.  - Continue remdesivir (started 1/10) - Continue steroids due to hypoxemia and negative FOBT - Continue baricitinib - Continue empiric abx with dense consolidations on CT.  - Encourage OOB, IS, FV, and awake proning if able - Continue airborne, contact precautions for 21 days from positive testing. - Monitor CMP and inflammatory markers - Bleeding noted as above. However, d-dimer also rising despite diminution in CRP. This was discussed with the patient who consents to prophylactic dose of lovenox given elevated VTE risk.   Steroid-induced hyperglycemia: HbA1c 5.2% not consistent with diabetes, though this appears to have been drawn on post-transfusion collection. - Continue SSI, though if remains at inpatient goal, would DC this.  - Consider repeat HbA1c at follow up.  Troponin elevation: Mild, downtrending without anginal features.  - Consider risk stratification after acute illness.   Obesity: Estimated body mass index is 37.28 kg/m as calculated from the following:   Height as of 12/20/20: 5\' 7"  (1.702 m).   Weight as of 12/20/20: 108 kg.  DVT prophylaxis: SCDs, lovenox Code Status: Full Family Communication: None at bedside Disposition Plan:  Status is: Inpatient  Remains inpatient appropriate because:Inpatient level of care appropriate due to severity of illness  Dispo: The patient is from: Home              Anticipated d/c is to: Home              Anticipated d/c date is: 3 days              Patient currently is not medically stable to d/c.  Consultants:   None  Procedures:   None  Antimicrobials:  Remdesivir   Ceftriaxone,  azithromycin  Subjective: Overnight has felt more weak and cold, also having bleeding that is not profuse but typical of menses. No abdominal/pelvic pain, GI bleeding, hematuria, or other bleeding noted. No chest pain, still dyspneic on exertion.   Objective: Vitals:   12/25/20 0035 12/25/20 0614 12/25/20 1311 12/26/20 0648   BP: 108/60 113/73 (!) 114/54 (!) 116/59  Pulse: 73 73 64 (!) 55  Resp: (!) 22 (!) 22 13 15   Temp: 98.6 F (37 C) 98.3 F (36.8 C) 97.9 F (36.6 C) 98 F (36.7 C)  TempSrc: Oral Oral Oral Oral  SpO2: 94% 95% 95% 98%    Intake/Output Summary (Last 24 hours) at 12/26/2020 1211 Last data filed at 12/26/2020 2778 Gross per 24 hour  Intake 605.33 ml  Output 1550 ml  Net -944.67 ml   Gen: 35 y.o. female in no distress, possibly more pale than prior. Pulm: Nonlabored breathing supplemental oxygen. Diminished mostly at bases. CV: Regular rate and rhythm. No tachycardia. No murmur, rub, or gallop. No JVD, no pitting dependent edema. GI: Abdomen soft, non-tender, non-distended, with normoactive bowel sounds.  Ext: Warm, no deformities Skin: No rashes, lesions or ulcers on visualized skin. Neuro: Alert and oriented. No focal neurological deficits. Psych: Judgement and insight appear fair. Mood euthymic & affect congruent. Behavior is appropriate.    Data Reviewed: I have personally reviewed following labs and imaging studies  CBC: Recent Labs  Lab 12/23/20 2002 12/24/20 0900 12/24/20 1900 12/25/20 0451 12/26/20 0516 12/26/20 1142  WBC 2.7*  --   --  1.9* 2.9*  --   NEUTROABS 1.8  --   --  0.9* 1.7  --   HGB 3.4* 6.9* 8.2* 7.3* 7.9* 8.3*  HCT 13.9* 24.3* 28.9* 26.5* 28.8* 29.9*  MCV 61.8*  --   --  76.8* 77.8*  --   PLT 313  --   --  238 229  --    Basic Metabolic Panel: Recent Labs  Lab 12/23/20 2002 12/24/20 0900 12/25/20 0451 12/26/20 0516  NA 126*  --  136 138  K 4.2  --  4.1 4.3  CL 91*  --  100 99  CO2 24  --  25 25  GLUCOSE 107*  --  172* 132*  BUN 16  --  16 17  CREATININE 0.88  --  0.71 0.74  CALCIUM 8.1*  --  8.5* 9.2  MG  --  2.2  --   --    GFR: Estimated Creatinine Clearance: 124.3 mL/min (by C-G formula based on SCr of 0.74 mg/dL). Liver Function Tests: Recent Labs  Lab 12/23/20 2002 12/25/20 0451 12/26/20 0516  AST 32 22 22  ALT 14 12 14    ALKPHOS 58 54 57  BILITOT 0.8 0.6 0.4  PROT 6.3* 6.3* 6.8  ALBUMIN 3.3* 3.3* 3.5   Recent Labs  Lab 12/24/20 0900  LIPASE 25   No results for input(s): AMMONIA in the last 168 hours. Coagulation Profile: Recent Labs  Lab 12/23/20 2040  INR 1.1   Cardiac Enzymes: No results for input(s): CKTOTAL, CKMB, CKMBINDEX, TROPONINI in the last 168 hours. BNP (last 3 results) No results for input(s): PROBNP in the last 8760 hours. HbA1C: Recent Labs    12/25/20 0451  HGBA1C 5.2   CBG: Recent Labs  Lab 12/25/20 0811 12/25/20 1111 12/25/20 1558 12/25/20 1937 12/26/20 1009  GLUCAP 159* 134* 169* 131* 93   Lipid Profile: Recent Labs    12/24/20 0900  TRIG 128   Thyroid  Function Tests: No results for input(s): TSH, T4TOTAL, FREET4, T3FREE, THYROIDAB in the last 72 hours. Anemia Panel: Recent Labs    12/24/20 0900  VITAMINB12 219  FOLATE 4.2*  FERRITIN 11  TIBC 353  IRON 13*  RETICCTPCT <0.4*   Urine analysis: No results found for: COLORURINE, APPEARANCEUR, LABSPEC, PHURINE, GLUCOSEU, HGBUR, BILIRUBINUR, KETONESUR, PROTEINUR, UROBILINOGEN, NITRITE, LEUKOCYTESUR Recent Results (from the past 240 hour(s))  Culture, blood (single)     Status: None (Preliminary result)   Collection Time: 12/23/20  8:16 PM   Specimen: BLOOD  Result Value Ref Range Status   Specimen Description   Final    BLOOD RIGHT ANTECUBITAL Performed at [facility] [address] [place], [place] [address]    Special Requests   Final    BOTTLES DRAWN AEROBIC AND ANAEROBIC Blood Culture adequate volume Performed at [facility] [address], [place], [place] [address]    Culture   Final    NO GROWTH 3 DAYS Performed at [facility], [place] [address], [place], [place] [address]    Report Status PENDING  Incomplete  Resp Panel by RT-PCR (Flu A&B, Covid) Nasopharyngeal Swab     Status: Abnormal   Collection Time: 12/23/20  9:06 PM   Specimen:  Nasopharyngeal Swab; Nasopharyngeal(NP) swabs in vial transport medium  Result Value Ref Range Status   SARS Coronavirus 2 by RT PCR POSITIVE (A) NEGATIVE Final    Comment: CRITICAL RESULT CALLED TO, READ BACK BY AND VERIFIED WITH: JERRY DAVIDSON RN 12/23/2020 @2310  [place] [facility] (NOTE) SARS-CoV-2 target nucleic acids are DETECTED.  The SARS-CoV-2 RNA is generally detectable in upper respiratory specimens during the acute phase of infection. Positive results are indicative of the presence of the identified virus, but do not rule out bacterial infection or co-infection with other pathogens not detected by the test. Clinical correlation with patient history and other diagnostic information is necessary to determine patient infection status. The expected result is Negative.  Fact Sheet for Patients: EntrepreneurPulse.com.au  Fact Sheet for Healthcare Providers: IncredibleEmployment.be  This test is not yet approved or cleared by the [place] FDA and  has been authorized for detection and/or diagnosis of SARS-CoV-2 by FDA under an Emergency Use Authorization (EUA).  This EUA will remain in effect  (meaning this test can be used) for the duration of  the COVID-19 declaration under Section 564(b)(1) of the Act, 21 U.S.C. section 360bbb-3(b)(1), unless the authorization is terminated or revoked sooner.     Influenza A by PCR NEGATIVE NEGATIVE Final   Influenza B by PCR NEGATIVE NEGATIVE Final    Comment: (NOTE) The Xpert Xpress SARS-CoV-2/FLU/RSV plus assay is intended as an aid in the diagnosis of influenza from Nasopharyngeal swab specimens and should not be used as a sole basis for treatment. Nasal washings and aspirates are unacceptable for Xpert Xpress SARS-CoV-2/FLU/RSV testing.  Fact Sheet for Patients: EntrepreneurPulse.com.au  Fact Sheet for Healthcare Providers: IncredibleEmployment.be  This  test is not yet approved or cleared by the [place] FDA and has been authorized for detection and/or diagnosis of SARS-CoV-2 by FDA under an Emergency Use Authorization (EUA). This EUA will remain in effect (meaning this test can be used) for the duration of the COVID-19 declaration under Section 564(b)(1) of the Act, 21 U.S.C. section 360bbb-3(b)(1), unless the authorization is terminated or revoked.  Performed at [facility], [place] [address], [place], [place] [address]       Radiology Studies: No  results found.  Scheduled Meds: . albuterol  2 puff Inhalation Q6H  . vitamin C  500 mg Oral Daily  . baricitinib  4 mg Oral Daily  . cholecalciferol  1,000 Units Oral Daily  . enoxaparin (LOVENOX) injection  0.5 mg/kg Subcutaneous Q24H  . folic acid  1 mg Oral Daily  . insulin aspart  0-5 Units Subcutaneous QHS  . insulin aspart  0-9 Units Subcutaneous TID WC  . methylPREDNISolone (SOLU-MEDROL) injection  0.5 mg/kg Intravenous Q12H   Followed by  . [START ON 12/27/2020] predniSONE  50 mg Oral Daily  . norethindrone  5 mg Oral TID  . zinc sulfate  220 mg Oral Daily   Continuous Infusions: . azithromycin 500 mg (12/25/20 2201)  . cefTRIAXone (ROCEPHIN)  IV 2 g (12/25/20 2100)  . remdesivir 100 mg in NS 100 mL 100 mg (12/26/20 1015)     LOS: 2 days   Time spent: 35 minutes.  Patrecia Pour, MD [facility] [email or web address] 12/26/2020, 12:11 PM

## 2020-12-26 NOTE — Plan of Care (Signed)

## 2020-12-26 NOTE — Progress Notes (Signed)
Physical Therapy Treatment Patient Details Name: BRITTANIE DOSANJH MRN: [id number] DOB: [birth year] Today's Date: 12/26/2020    History of Present Illness ZHARIA CONROW is a 35 y.o. female with medical history significant of difficulty hearing, obesity .She had a positive outpatient Covid test on 12/19/2020.  She was seen in the ED on 1/6 for cough and shortness of breath.  She was discharged with referral for monoclonal antibody infusion.  Presenting to the ED1/9/22  with complaints of worsening shortness of breath and fatigue. CT angiogram chest  showing no visible large or central PE.  Showing extensive bilateral airspace disease compatible with COVID-19 pneumonia.  Additionally, Hgb found to be 3.4g/dl in setting of menorrhagia, given 4 units PRBC.    PT Comments    Pt was on RA at arrival with sats 91% ; however, had significant drop in O2 sats with activity requiring O2 (see General comments).  Pt with slow responses and decreased initiation at times.  Mild unsteadiness with gait requiring min guard.  Continue to advance as able.    Follow Up Recommendations  No PT follow up     Equipment Recommendations  None recommended by PT    Recommendations for Other Services       Precautions / Restrictions Precautions Precautions: Fall Precaution Comments: moniltor sats/HR    Mobility  Bed Mobility Overal bed mobility: Needs Assistance Bed Mobility: Supine to Sit;Sit to Supine     Supine to sit: Supervision;HOB elevated Sit to supine: Supervision;HOB elevated   General bed mobility comments: supervision for lines/leads  Transfers Overall transfer level: Needs assistance Equipment used: None Transfers: Sit to/from Stand Sit to Stand: Supervision         General transfer comment: Performed x 3 with supervision  Ambulation/Gait Ambulation/Gait assistance: Min guard Gait Distance (Feet): 60 Feet (60'x3) Assistive device: None Gait Pattern/deviations: Step-to pattern;Decreased  stride length;Wide base of support Gait velocity: decreased   General Gait Details: Ambulated 60'x3 with varying levels of oxygen (see general comments).  Pt with mild unsteadiness requiring min guard for safety.   Stairs             Wheelchair Mobility    Modified Rankin (Stroke Patients Only)       Balance Overall balance assessment: Needs assistance   Sitting balance-Leahy Scale: Good     Standing balance support: No upper extremity supported Standing balance-Leahy Scale: Fair Standing balance comment: some unsteadiness with gait requiring min guard                            Cognition Arousal/Alertness: Awake/alert Behavior During Therapy: Flat affect Overall Cognitive Status: Within Functional Limits for tasks assessed                                 General Comments: At times slow to respond      Exercises Other Exercises Other Exercises: Performed IS x 4 with cues and encouragement for deeper breaths.  Able to draw in 500-700 mL.    General Comments General comments (skin integrity, edema, etc.): Pt was on RA at arrival with sats 91%.  Ambulated on RA and sats down to 68% and DOE of 3/4 - required 1.5 mins O2 at 4 L to recover then decreased to 2 L with sats 94%.  Then tried ambulation on 2 L but sats down to 74% with DOE of 3/4  again with similar recovery.  Last bout with 3 L O2 with sats down to 78% and DOE of 2/4, similar recovery.  Once pt resting and on 3 L tried to decrease back to 2 L but sats dropping to 84%.  Left on 3 L with sats 90%.      Pertinent Vitals/Pain Pain Assessment: 0-10 Pain Score: 4  Pain Location: R foot Pain Descriptors / Indicators: Discomfort Pain Intervention(s): Limited activity within patient's tolerance;Monitored during session    Home Living                      Prior Function            PT Goals (current goals can now be found in the care plan section) Acute Rehab PT  Goals Patient Stated Goal: to go home PT Goal Formulation: With patient Time For Goal Achievement: 01/07/21 Potential to Achieve Goals: Good Progress towards PT goals: Progressing toward goals    Frequency    Min 3X/week      PT Plan Current plan remains appropriate    Co-evaluation              AM-PAC PT "6 Clicks" Mobility   Outcome Measure  Help needed turning from your back to your side while in a flat bed without using bedrails?: None Help needed moving from lying on your back to sitting on the side of a flat bed without using bedrails?: None Help needed moving to and from a bed to a chair (including a wheelchair)?: A Little Help needed standing up from a chair using your arms (e.g., wheelchair or bedside chair)?: A Little Help needed to walk in hospital room?: A Little Help needed climbing 3-5 steps with a railing? : A Little 6 Click Score: 20    End of Session Equipment Utilized During Treatment: Oxygen Activity Tolerance: Patient tolerated treatment well Patient left: in bed;with call bell/phone within reach Nurse Communication: Mobility status PT Visit Diagnosis: Unsteadiness on feet (R26.81);Difficulty in walking, not elsewhere classified (R26.2)     Time: 0093-8182 PT Time Calculation (min) (ACUTE ONLY): 29 min  Charges:  $Gait Training: 23-37 mins                     Abran Richard, PT Acute Rehab Services Pager [phone] [facility] 12/26/2020, 4:44 PM

## 2020-12-27 LAB — TYPE AND SCREEN
ABO/RH(D): O POS
Antibody Screen: NEGATIVE
Unit division: 0
Unit division: 0
Unit division: 0
Unit division: 0
Unit division: 0

## 2020-12-27 LAB — BPAM RBC
Blood Product Expiration Date: 202202102359
Blood Product Expiration Date: 202202102359
Blood Product Expiration Date: 202202102359
Blood Product Expiration Date: 202202102359
Blood Product Expiration Date: 202202112359
ISSUE DATE / TIME: 202201092154
ISSUE DATE / TIME: 202201100017
ISSUE DATE / TIME: 202201100430
ISSUE DATE / TIME: 202201101313
Unit Type and Rh: 5100
Unit Type and Rh: 5100
Unit Type and Rh: 5100
Unit Type and Rh: 5100
Unit Type and Rh: 5100

## 2020-12-27 LAB — CBC WITH DIFFERENTIAL/PLATELET
Abs Immature Granulocytes: 0.04 10*3/uL (ref 0.00–0.07)
Basophils Absolute: 0 10*3/uL (ref 0.0–0.1)
Basophils Relative: 0 %
Eosinophils Absolute: 0 10*3/uL (ref 0.0–0.5)
Eosinophils Relative: 0 %
HCT: 29.7 % — ABNORMAL LOW (ref 36.0–46.0)
Hemoglobin: 8.3 g/dL — ABNORMAL LOW (ref 12.0–15.0)
Immature Granulocytes: 1 %
Lymphocytes Relative: 24 %
Lymphs Abs: 0.8 10*3/uL (ref 0.7–4.0)
MCH: 21.4 pg — ABNORMAL LOW (ref 26.0–34.0)
MCHC: 27.9 g/dL — ABNORMAL LOW (ref 30.0–36.0)
MCV: 76.5 fL — ABNORMAL LOW (ref 80.0–100.0)
Monocytes Absolute: 0.4 10*3/uL (ref 0.1–1.0)
Monocytes Relative: 12 %
Neutro Abs: 2.2 10*3/uL (ref 1.7–7.7)
Neutrophils Relative %: 63 %
Platelets: 195 10*3/uL (ref 150–400)
RBC: 3.88 MIL/uL (ref 3.87–5.11)
RDW: 28.9 % — ABNORMAL HIGH (ref 11.5–15.5)
WBC: 3.5 10*3/uL — ABNORMAL LOW (ref 4.0–10.5)
nRBC: 10.5 % — ABNORMAL HIGH (ref 0.0–0.2)

## 2020-12-27 LAB — COMPREHENSIVE METABOLIC PANEL
ALT: 18 U/L (ref 0–44)
AST: 25 U/L (ref 15–41)
Albumin: 3.6 g/dL (ref 3.5–5.0)
Alkaline Phosphatase: 58 U/L (ref 38–126)
Anion gap: 15 (ref 5–15)
BUN: 14 mg/dL (ref 6–20)
CO2: 26 mmol/L (ref 22–32)
Calcium: 9.2 mg/dL (ref 8.9–10.3)
Chloride: 99 mmol/L (ref 98–111)
Creatinine, Ser: 0.66 mg/dL (ref 0.44–1.00)
GFR, Estimated: >60 mL/min (ref >60)
Glucose, Bld: 137 mg/dL — ABNORMAL HIGH (ref 70–99)
Potassium: 4 mmol/L (ref 3.5–5.1)
Sodium: 140 mmol/L (ref 135–145)
Total Bilirubin: 0.6 mg/dL (ref 0.3–1.2)
Total Protein: 6.8 g/dL (ref 6.5–8.1)

## 2020-12-27 LAB — GLUCOSE, CAPILLARY
Glucose-Capillary: 125 mg/dL — ABNORMAL HIGH (ref 70–99)
Glucose-Capillary: 93 mg/dL (ref 70–99)
Glucose-Capillary: 141 mg/dL — ABNORMAL HIGH (ref 70–99)

## 2020-12-27 LAB — D-DIMER, QUANTITATIVE: D-Dimer, Quant: 2.89 ug/mL-FEU — ABNORMAL HIGH (ref 0.00–0.50)

## 2020-12-27 LAB — C-REACTIVE PROTEIN: CRP: 0.6 mg/dL (ref <1.0)

## 2020-12-27 MED ORDER — SODIUM CHLORIDE 0.9 % IV SOLN
510.0000 mg | Freq: Once | INTRAVENOUS | Status: AC
  Administered 2020-12-27: 510 mg via INTRAVENOUS
  Filled 2020-12-27: qty 510

## 2020-12-27 MED ORDER — NORETHINDRONE ACETATE 5 MG PO TABS
5.0000 mg | ORAL_TABLET | Freq: Every day | ORAL | Status: DC
  Administered 2020-12-28: 5 mg via ORAL
  Filled 2020-12-27: qty 1

## 2020-12-27 NOTE — Plan of Care (Signed)

## 2020-12-27 NOTE — Progress Notes (Addendum)
PROGRESS NOTE  Caitlyn King  PIR:[id number] DOB: Dec 05, 1986 DOA: 12/23/2020 PCP: Patient, No Pcp Per  Brief Narrative: Caitlyn King is a 35 y.o. female with a history of obesity, HOH, menorrhagia, covid-19 diagnosed 1/5, initially presented to ED 1/6 with shortness of breath but was not hypoxic and discharged home with referral for monoclonal antibody, and returned to the ED 1/9 with worsening symptoms, namely fatigue and dyspnea. Hgb found to be 3.4g/dl in setting of menorrhagia and workup also positive for covid pneumonia with hypoxia. 4u PRBCs were given with improvement in anemia. Remdesivir, solumedrol, and baricitinib are given for covid-19 pneumonia. Pelvic ultrasound confirmed fibroid uterus and menstrual bleeding is now noted. Blood counts are being monitored serially and norethindrone started.  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 FOBT negative. +fibroids on U/S.  - Taper norethindrone today since bleeding significantly improved. This was previously d/w Dr. Si Raider. Will need GYN follow up for Bx, etc. - s/p 4u PRBCs with hgb up and stabilized, bleeding resolved.  - Supplement folic acid - Give IV iron. Risk of anaphylaxis reviewed, NKDA. Note negative reticulocyte count.  Acute hypoxic respiratory failure due to covid-19 pneumonia: SARS-CoV-2 positive on1/5.  - Wean oxygen to off, check ambulatory pulse ox -Continueremdesivir (started 1/10) -Continuesteroids due to hypoxemia and negative FOBT - Continue  baricitinib - Continue empiric abx with dense consolidations on CT.  - Encourage OOB, IS, FV, and awake proning if able - Continue airborne, contact precautions for 21 days from positive testing. - Monitor CMP and inflammatory markers - Bleeding noted as above. However, d-dimer also rising despite diminution in CRP. This was discussed with the patient who consents to prophylactic dose of lovenox given elevated VTE risk.  Steroid-induced hyperglycemia: HbA1c 5.2% not consistent with diabetes, though this appears to have been drawn on post-transfusion collection. - Continue SSI, though if remains at inpatient goal, would DC this.  - Consider repeat HbA1c at follow up.  Troponin elevation: Mild, downtrending without anginal features.  - Consider risk stratification after acute illness.  Obesity: Estimated body mass index is 37.28 kg/m as calculated from the following:   Height as of 12/20/20: 5\' 7"  (1.702 m).   Weight as of 12/20/20: 108 kg.   DVT prophylaxis: Lovenox Code Status: Full Family Communication: None at bedside Disposition Plan:  Status is: Inpatient  Remains inpatient appropriate because:Inpatient level of care appropriate due to severity of illness  Dispo: The patient is from: Home              Anticipated d/c is to: Home              Anticipated d/c date is: 1 day              Patient currently is not medically stable to d/c.  Consultants:   None  Procedures:   None  Antimicrobials:  Remdesivir   Ceftriaxone, azithromycin  Subjective: Feeling better with no dyspnea at rest, still fatigues with exertion but denies shortness of breath. Has continued to use oxygen. No chest pain. Bleeding  has subsided.  Objective: Vitals:   12/26/20 1513 12/26/20 1645 12/26/20 2139 12/27/20 0412  BP: (!) 118/52  (!) 115/59 (!) 119/58  Pulse: 66  (!) 57 (!) 55  Resp: (!) 22  18 20   Temp: 98.5 F (36.9 C) 98.1 F (36.7 C) 97.6 F (36.4 C) 97.7 F (36.5 C)   TempSrc: Oral Oral Oral Oral  SpO2: (!) 89%  98% 95%    Intake/Output Summary (Last 24 hours) at 12/27/2020 1214 Last data filed at 12/27/2020 0400 Gross per 24 hour  Intake 1190 ml  Output 600 ml  Net 590 ml   Gen: 35 y.o. female in no distress Pulm: Nonlabored breathing room air. Clear. CV: Regular rate and rhythm. No murmur, rub, or gallop. No JVD, no dependent edema. GI: Abdomen soft, non-tender, non-distended, with normoactive bowel sounds.  Ext: Warm, no deformities Skin: No rashes, lesions or ulcers on visualized skin. Neuro: Alert and oriented. No focal neurological deficits. Psych: Judgement and insight appear fair. Mood euthymic & affect congruent. Behavior is appropriate.    Data Reviewed: I have personally reviewed following labs and imaging studies  CBC: Recent Labs  Lab 12/23/20 2002 12/24/20 0900 12/24/20 1900 12/25/20 0451 12/26/20 0516 12/26/20 1142 12/27/20 0857  WBC 2.7*  --   --  1.9* 2.9*  --  3.5*  NEUTROABS 1.8  --   --  0.9* 1.7  --  2.2  HGB 3.4*   < > 8.2* 7.3* 7.9* 8.3* 8.3*  HCT 13.9*   < > 28.9* 26.5* 28.8* 29.9* 29.7*  MCV 61.8*  --   --  76.8* 77.8*  --  76.5*  PLT 313  --   --  238 229  --  195   < > = values in this interval not displayed.   Basic Metabolic Panel: Recent Labs  Lab 12/23/20 2002 12/24/20 0900 12/25/20 0451 12/26/20 0516 12/27/20 0857  NA 126*  --  136 138 140  K 4.2  --  4.1 4.3 4.0  CL 91*  --  100 99 99  CO2 24  --  25 25 26   GLUCOSE 107*  --  172* 132* 137*  BUN 16  --  16 17 14   CREATININE 0.88  --  0.71 0.74 0.66  CALCIUM 8.1*  --  8.5* 9.2 9.2  MG  --  2.2  --   --   --    GFR: Estimated Creatinine Clearance: 124.3 mL/min (by C-G formula based on SCr of 0.66 mg/dL). Liver Function Tests: Recent Labs  Lab 12/23/20 2002 12/25/20 0451 12/26/20 0516 12/27/20 0857  AST 32 22 22 25   ALT 14 12 14 18   ALKPHOS 58 54 57 58  BILITOT 0.8 0.6 0.4 0.6  PROT 6.3* 6.3* 6.8 6.8  ALBUMIN 3.3* 3.3* 3.5 3.6    Recent Labs  Lab 12/24/20 0900  LIPASE 25   No results for input(s): AMMONIA in the last 168 hours. Coagulation Profile: Recent Labs  Lab 12/23/20 2040  INR 1.1   Cardiac Enzymes: No results for input(s): CKTOTAL, CKMB, CKMBINDEX, TROPONINI in the last 168 hours. BNP (last 3 results) No results for input(s): PROBNP in the last 8760 hours. HbA1C: Recent Labs    12/25/20 0451  HGBA1C 5.2   CBG: Recent Labs  Lab 12/26/20 1419 12/26/20 1643 12/26/20 2136 12/27/20 0817 12/27/20 1120  GLUCAP 107* 94 93 125* 93   Lipid Profile: No results for input(s): CHOL, HDL, LDLCALC, TRIG, CHOLHDL, LDLDIRECT in the last  72 hours. Thyroid Function Tests: No results for input(s): TSH, T4TOTAL, FREET4, T3FREE, THYROIDAB in the last 72 hours. Anemia Panel: No results for input(s): VITAMINB12, FOLATE, FERRITIN, TIBC, IRON, RETICCTPCT in the last 72 hours. Urine analysis: No results found for: COLORURINE, APPEARANCEUR, LABSPEC, PHURINE, GLUCOSEU, HGBUR, BILIRUBINUR, KETONESUR, PROTEINUR, UROBILINOGEN, NITRITE, LEUKOCYTESUR Recent Results (from the past 240 hour(s))  Culture, blood (single)     Status: None (Preliminary result)   Collection Time: 12/23/20  8:16 PM   Specimen: BLOOD  Result Value Ref Range Status   Specimen Description   Final    BLOOD RIGHT ANTECUBITAL Performed at [facility] [address], [place], [place] [address]    Special Requests   Final    BOTTLES DRAWN AEROBIC AND ANAEROBIC Blood Culture adequate volume Performed at [facility] [address], [place], [place] [address]    Culture   Final    NO GROWTH 4 DAYS Performed at [facility], [place] [address], [place], [place] [address]    Report Status PENDING  Incomplete  Resp Panel by RT-PCR (Flu A&B, Covid) Nasopharyngeal Swab     Status: Abnormal   Collection Time: 12/23/20  9:06 PM   Specimen: Nasopharyngeal Swab; Nasopharyngeal(NP) swabs in vial transport  medium  Result Value Ref Range Status   SARS Coronavirus 2 by RT PCR POSITIVE (A) NEGATIVE Final    Comment: CRITICAL RESULT CALLED TO, READ BACK BY AND VERIFIED WITH: JERRY DAVIDSON RN 12/23/2020 @2310  [place] P.H[facility] (NOTE) SARS-CoV-2 target nucleic acids are DETECTED.  The SARS-CoV-2 RNA is generally detectable in upper respiratory specimens during the acute phase of infection. Positive results are indicative of the presence of the identified virus, but do not rule out bacterial infection or co-infection with other pathogens not detected by the test. Clinical correlation with patient history and other diagnostic information is necessary to determine patient infection status. The expected result is Negative.  Fact Sheet for Patients: EntrepreneurPulse.com.au  Fact Sheet for Healthcare Providers: IncredibleEmployment.be  This test is not yet approved or cleared by the [place] FDA and  has been authorized for detection and/or diagnosis of SARS-CoV-2 by FDA under an Emergency Use Authorization (EUA).  This EUA will remain in effect  (meaning this test can be used) for the duration of  the COVID-19 declaration under Section 564(b)(1) of the Act, 21 U.S.C. section 360bbb-3(b)(1), unless the authorization is terminated or revoked sooner.     Influenza A by PCR NEGATIVE NEGATIVE Final   Influenza B by PCR NEGATIVE NEGATIVE Final    Comment: (NOTE) The Xpert Xpress SARS-CoV-2/FLU/RSV plus assay is intended as an aid in the diagnosis of influenza from Nasopharyngeal swab specimens and should not be used as a sole basis for treatment. Nasal washings and aspirates are unacceptable for Xpert Xpress SARS-CoV-2/FLU/RSV testing.  Fact Sheet for Patients: EntrepreneurPulse.com.au  Fact Sheet for Healthcare Providers: IncredibleEmployment.be  This test is not yet approved or cleared by the [place] FDA  and has been authorized for detection and/or diagnosis of SARS-CoV-2 by FDA under an Emergency Use Authorization (EUA). This EUA will remain in effect (meaning this test can be used) for the duration of the COVID-19 declaration under Section 564(b)(1) of the Act, 21 U.S.C. section 360bbb-3(b)(1), unless the authorization is terminated or revoked.  Performed at [facility], [place] [address], [place],  [address]       Radiology Studies: No results found.  Scheduled Meds: . albuterol  2  puff Inhalation Q6H  . vitamin C  500 mg Oral Daily  . baricitinib  4 mg Oral Daily  . cholecalciferol  1,000 Units Oral Daily  . enoxaparin (LOVENOX) injection  0.5 mg/kg Subcutaneous Q24H  . folic acid  1 mg Oral Daily  . insulin aspart  0-5 Units Subcutaneous QHS  . insulin aspart  0-9 Units Subcutaneous TID WC  . [START ON 12/28/2020] norethindrone  5 mg Oral Daily  . predniSONE  50 mg Oral Daily  . zinc sulfate  220 mg Oral Daily   Continuous Infusions: . azithromycin 500 mg (12/26/20 2254)  . cefTRIAXone (ROCEPHIN)  IV 2 g (12/26/20 2128)  . ferumoxytol    . remdesivir 100 mg in NS 100 mL 100 mg (12/27/20 1011)     LOS: 3 days   Time spent: 35 minutes.  Patrecia Pour, MD [facility] [email or web address] 12/27/2020, 12:14 PM

## 2020-12-28 DIAGNOSIS — D649 Anemia, unspecified: Secondary | ICD-10-CM

## 2020-12-28 LAB — COMPREHENSIVE METABOLIC PANEL
ALT: 16 U/L (ref 0–44)
AST: 22 U/L (ref 15–41)
Albumin: 3.1 g/dL — ABNORMAL LOW (ref 3.5–5.0)
Alkaline Phosphatase: 48 U/L (ref 38–126)
Anion gap: 12 (ref 5–15)
BUN: 7 mg/dL (ref 6–20)
CO2: 24 mmol/L (ref 22–32)
Calcium: 8.5 mg/dL — ABNORMAL LOW (ref 8.9–10.3)
Chloride: 104 mmol/L (ref 98–111)
Creatinine, Ser: 0.57 mg/dL (ref 0.44–1.00)
GFR, Estimated: >60 mL/min (ref >60)
Glucose, Bld: 98 mg/dL (ref 70–99)
Potassium: 3.4 mmol/L — ABNORMAL LOW (ref 3.5–5.1)
Sodium: 140 mmol/L (ref 135–145)
Total Bilirubin: 0.4 mg/dL (ref 0.3–1.2)
Total Protein: 5.6 g/dL — ABNORMAL LOW (ref 6.5–8.1)

## 2020-12-28 LAB — CULTURE, BLOOD (SINGLE)
Culture: NO GROWTH
Special Requests: ADEQUATE

## 2020-12-28 LAB — CBC WITH DIFFERENTIAL/PLATELET
Abs Immature Granulocytes: 0.1 10*3/uL — ABNORMAL HIGH (ref 0.00–0.07)
Basophils Absolute: 0 10*3/uL (ref 0.0–0.1)
Basophils Relative: 0 %
Eosinophils Absolute: 0 10*3/uL (ref 0.0–0.5)
Eosinophils Relative: 0 %
HCT: 26.6 % — ABNORMAL LOW (ref 36.0–46.0)
Hemoglobin: 7.4 g/dL — ABNORMAL LOW (ref 12.0–15.0)
Immature Granulocytes: 2 %
Lymphocytes Relative: 26 %
Lymphs Abs: 1.4 10*3/uL (ref 0.7–4.0)
MCH: 21.4 pg — ABNORMAL LOW (ref 26.0–34.0)
MCHC: 27.8 g/dL — ABNORMAL LOW (ref 30.0–36.0)
MCV: 77.1 fL — ABNORMAL LOW (ref 80.0–100.0)
Monocytes Absolute: 0.8 10*3/uL (ref 0.1–1.0)
Monocytes Relative: 14 %
Neutro Abs: 3.1 10*3/uL (ref 1.7–7.7)
Neutrophils Relative %: 58 %
Platelets: 170 10*3/uL (ref 150–400)
RBC: 3.45 MIL/uL — ABNORMAL LOW (ref 3.87–5.11)
RDW: 28.4 % — ABNORMAL HIGH (ref 11.5–15.5)
WBC: 5.4 10*3/uL (ref 4.0–10.5)
nRBC: 6.6 % — ABNORMAL HIGH (ref 0.0–0.2)

## 2020-12-28 LAB — D-DIMER, QUANTITATIVE: D-Dimer, Quant: 2.81 ug/mL-FEU — ABNORMAL HIGH (ref 0.00–0.50)

## 2020-12-28 LAB — C-REACTIVE PROTEIN: CRP: 0.8 mg/dL (ref <1.0)

## 2020-12-28 LAB — PREPARE RBC (CROSSMATCH)

## 2020-12-28 MED ORDER — FOLIC ACID 1 MG PO TABS: 1.0000 mg | ORAL_TABLET | Freq: Every day | ORAL | 2 refills | Status: DC

## 2020-12-28 MED ORDER — SODIUM CHLORIDE 0.9% IV SOLUTION: Freq: Once | INTRAVENOUS | Status: AC

## 2020-12-28 MED ORDER — NORETHINDRONE ACETATE 5 MG PO TABS: ORAL_TABLET | ORAL | 0 refills | Status: DC

## 2020-12-28 MED ORDER — SODIUM CHLORIDE 0.9 % IV SOLN
2.0000 g | INTRAVENOUS | Status: AC
  Administered 2020-12-28: 2 g via INTRAVENOUS
  Filled 2020-12-28: qty 2

## 2020-12-28 MED ORDER — PREDNISONE 20 MG PO TABS: 40.0000 mg | ORAL_TABLET | Freq: Every day | ORAL | 0 refills | Status: AC

## 2020-12-28 MED ORDER — FERROUS SULFATE 325 (65 FE) MG PO TABS: 325.0000 mg | ORAL_TABLET | Freq: Every day | ORAL | 2 refills | Status: AC

## 2020-12-28 NOTE — Plan of Care (Signed)
Instructions were reviewed with patient. All questions were answered. Patient was transported to main entrance by wheelchair. ° °

## 2020-12-28 NOTE — Progress Notes (Signed)
SATURATION QUALIFICATIONS: (This note is used to comply with regulatory documentation for home oxygen)  Patient Saturations on Room Air at Rest = 97%  Patient Saturations on Room Air while Ambulating = 92%    Patient did not need oxygen to maintain adequate oxygen saturation.

## 2020-12-28 NOTE — Discharge Summary (Signed)
Physician Discharge Summary  Caitlyn King [id number] DOB: 06-29-1986 DOA: 12/23/2020  PCP: Patient, No Pcp Per  Admit date: 12/23/2020 Discharge date: 12/28/2020  Admitted From: Home Disposition: Home   Recommendations for Outpatient Follow-up:  1. Follow up with PCP in 1-2 weeks 2. Establish care with OBGYN for evaluation and management of AUB due to uterine fibroids. Contact for [facility] provided at discharge.   Home Health: None Equipment/Devices: None Discharge Condition: Stable CODE STATUS: Full Diet recommendation: Heart healthy  Brief/Interim Summary: Caitlyn King is a 35 y.o.femalewith a history of obesity, menorrhagia, covid-19 diagnosed 1/5, initially presented to ED 1/6 with shortness of breath but was not hypoxic and discharged home with referral for monoclonal antibody, and returned to the ED 1/9 with worsening symptoms, namely fatigue and dyspnea.Hgb found to be 3.4g/dl in setting of menorrhagia and workup also positive for covid pneumonia with hypoxia. 4u PRBCs were given with improvement in anemia. Remdesivir, solumedrol, and baricitinib are given for covid-19 pneumonia. Pelvic ultrasound confirmed fibroid uterus and menstrual bleeding was noted, treated with norethindrone. An additional unit of PRBCs will be transfused on 1/14 prior to discharge with plans to repeat CBC and follow up with OBGYN. Hypoxia has resolved and the patient has completed antiviral and antibiotic therapies prior to discharge.  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 FOBT negative. +fibroids on U/S.  -Continue norethindrone with directions to taper dose to limit/avoid uterine bleeding until following up with OBGYN. This was previously d/w Dr. Si Raider.Will need GYN follow up for Bx, etc. - s/p 4u PRBCs with hgb up as anticipated though has decreased below 8 and pt is symptomatic. Will give 1u PRBCs and request follow up labs after DC. Also received IV iron 1/13.  - Continue supplementation of iron and folic acid.   Acute hypoxic respiratory failure due to covid-19 pneumonia: SARS-CoV-2 positive on1/5. Hypoxia has resolved. -Completed remdesivir (1/10 - 1/14) -Continuesteroids for short course - Received baricitinib while admitted - Complete 5th day abx empirically on day of discharge with dense consolidations on CT.  - Continue airborne, contact precautions for 21 days from positive testing.  Steroid-induced hyperglycemia: HbA1c 5.2% not consistent with diabetes, though this appears to have been drawn on post-transfusion collection. - Hyperglycemia was mild and has abated. - Consider repeat HbA1c at follow up  Troponin elevation: Mild, downtrending without anginal features.  - Consider risk stratification after acute illness.  Obesity:Estimated body mass index is 37.28 kg/m as calculated from the following: Height as of 12/20/20: 5\' 7"  (1.702 m). Weight as of 12/20/20: 108 kg.  Discharge Instructions Discharge Instructions    Diet - low sodium heart healthy   Complete by: As directed    Discharge instructions   Complete by: As directed    You are being discharged from the hospital after treatment for covid-19 infection, bacterial pneumonia, and severe anemia due to blood loss. You are felt to be stable enough to no longer require inpatient monitoring, testing, and treatment, though you will need to follow the recommendations below: - Continue taking  iron and folic acid supplements  daily and call OB/GYN to schedule a follow up appointment as soon as possible.  - Continue taking norethindrone to suppress bleeding. You can start with 3 times daily and taper down to once daily as long as bleeding is stopped. - You have completed a course of antibiotics and antiviral, but will continue taking prednisone for 5 more days after discharge.  - Per CDC guidelines, you will need to remain in isolation for 21 days from your first positive covid test. - Follow up with your doctor in the next week via telehealth or seek medical attention right away if your symptoms get [place] seeing patients Tuesday and Thursday mornings from 8:00am-12:00pm. Appointments are scheduled by calling [phone] (8-5 M-F) - You are still encouraged to get a covid vaccination between 21 days (after isolation period ends) and 90 days (before immunity is thought to wear off).  Directions for you at home:  Wear a facemask You should wear a facemask that covers your nose and mouth when you are in the same room with other people and when you visit a healthcare provider. People who live with or visit you should also wear a facemask while they are in the same room with you.  Separate yourself from other people in your home As much as possible, you should stay in a different room from other people in your home. Also, you should use a separate bathroom, if available.  Avoid sharing household items You should not share dishes, drinking glasses, cups, eating utensils, towels, bedding, or other items with other people in your home. After using these items, you should wash them thoroughly with soap and water.  Cover your coughs and sneezes Cover your mouth and nose with a tissue when you cough or sneeze, or you can cough or sneeze into your sleeve. Throw used tissues in a lined trash can, and immediately wash your hands with soap and water for at least 20 seconds or use an alcohol-based  hand rub.  Wash your [facility] your hands often and thoroughly with soap and water for at least 20 seconds. You can use an alcohol-based hand sanitizer if soap and water are not available and if your hands are not visibly dirty. Avoid touching your eyes, nose, and mouth with unwashed hands.  Directions for those who live with, or provide care at home for you:  Limit the number of people who have contact with the patient If possible, have only one caregiver for the patient. Other household members should stay in another home or place of residence. If this is not possible, they should stay in another room, or be separated from the patient as much as possible. Use a separate bathroom, if available. Restrict visitors who do not have an essential need to be in the home.  Ensure good ventilation Make sure that shared spaces in the home have good air flow, such as from an air conditioner or an opened window, weather permitting.  Wash your hands often Wash your hands often and thoroughly with soap and water for at least 20 seconds. You can use an alcohol based hand sanitizer if soap and water are not available and if your hands are not visibly dirty. Avoid touching your eyes, nose, and mouth with unwashed hands. Use disposable paper towels to dry your hands. If not available, use dedicated cloth towels and replace them when they become wet.  Wear a facemask and gloves Wear  a disposable facemask at all times in the room and gloves when you touch or have contact with the patient's blood, body fluids, and/or secretions or excretions, such as sweat, saliva, sputum, nasal mucus, vomit, urine, or feces.  Ensure the mask fits over your nose and mouth tightly, and do not touch it during use. Throw out disposable facemasks and gloves after using them. Do not reuse. Wash your hands immediately after removing your facemask and gloves. If your personal clothing becomes contaminated, carefully remove  clothing and launder. Wash your hands after handling contaminated clothing. Place all used disposable facemasks, gloves, and other waste in a lined container before disposing them with other household waste. Remove gloves and wash your hands immediately after handling these items.  Do not share dishes, glasses, or other household items with the patient Avoid sharing household items. You should not share dishes, drinking glasses, cups, eating utensils, towels, bedding, or other items with a patient who is confirmed to have, or being evaluated for, COVID-19 infection. After the person uses these items, you should wash them thoroughly with soap and water.  Wash laundry thoroughly Immediately remove and wash clothes or bedding that have blood, body fluids, and/or secretions or excretions, such as sweat, saliva, sputum, nasal mucus, vomit, urine, or feces, on them. Wear gloves when handling laundry from the patient. Read and follow directions on labels of laundry or clothing items and detergent. In general, wash and dry with the warmest temperatures recommended on the label.  Clean all areas the individual has used often Clean all touchable surfaces, such as counters, tabletops, doorknobs, bathroom fixtures, toilets, phones, keyboards, tablets, and bedside tables, every day. Also, clean any surfaces that may have blood, body fluids, and/or secretions or excretions on them. Wear gloves when cleaning surfaces the patient has come in contact with. Use a diluted bleach solution (e.g., dilute bleach with 1 part bleach and 10 parts water) or a household disinfectant with a label that says EPA-registered for coronaviruses. To make a bleach solution at home, add 1 tablespoon of bleach to 1 quart (4 cups) of water. For a larger supply, add  cup of bleach to 1 gallon (16 cups) of water. Read labels of cleaning products and follow recommendations provided on product labels. Labels contain instructions for safe and  effective use of the cleaning product including precautions you should take when applying the product, such as wearing gloves or eye protection and making sure you have good ventilation during use of the product. Remove gloves and wash hands immediately after cleaning.  Monitor yourself for signs and symptoms of illness Caregivers and household members are considered close contacts, should monitor their health, and will be asked to limit movement outside of the home to the extent possible. Follow the monitoring steps for close contacts listed on the symptom monitoring form.  If you have additional questions, contact your local health department or call the epidemiologist on call at [phone] (available 24/7). This guidance is subject to change. For the most up-to-date guidance from [facility], please refer to their website: YouBlogs.pl   Increase activity slowly   Complete by: As directed    MyChart COVID-19 home monitoring program   Complete by: Dec 28, 2020    Is the patient willing to use the [place] for home monitoring?: Yes     Allergies as of 12/28/2020   No Known Allergies     Medication List    TAKE these medications   acetaminophen 650 MG CR  tablet Commonly known as: Tylenol 8 Hour Take 1 tablet (650 mg total) by mouth every 8 (eight) hours as needed for pain.   benzonatate 100 MG capsule Commonly known as: TESSALON Take 1 capsule (100 mg total) by mouth every 8 (eight) hours.   ferrous sulfate 325 (65 FE) MG tablet Take 1 tablet (325 mg total) by mouth daily.   folic acid 1 MG tablet Commonly known as: FOLVITE Take 1 tablet (1 mg total) by mouth daily. Start taking on: December 29, 2020   norethindrone 5 MG tablet Commonly known as: AYGESTIN Take 5mg  tablet three times per day until bleeding stops, then decrease to twice daily then once daily to stop bleeding   ondansetron 4 MG disintegrating  tablet Commonly known as: Zofran ODT Take 1 tablet (4 mg total) by mouth every 8 (eight) hours as needed for nausea or vomiting.   predniSONE 20 MG tablet Commonly known as: DELTASONE Take 2 tablets (40 mg total) by mouth daily for 5 days. Start taking on: December 29, 2020       [facility] Follow up.   Why: Call [phone] to make an appointment. Contact information: [place] [facility]  [place],  [place]  [address] [address]: [phone]             No Known Allergies  Consultations:  OB/GYN, Dr. Si Raider  Procedures/Studies: CT Angio Chest PE W/Cm &/Or Wo Cm  Result Date: 12/23/2020 CLINICAL DATA:  Cough, COVID EXAM: CT ANGIOGRAPHY CHEST WITH CONTRAST TECHNIQUE: Multidetector CT imaging of the chest was performed using the standard protocol during bolus administration of intravenous contrast. Multiplanar CT image reconstructions and MIPs were obtained to evaluate the vascular anatomy. CONTRAST:  132mL OMNIPAQUE IOHEXOL 350 MG/ML SOLN COMPARISON:  None. FINDINGS: Cardiovascular: Study severely limited due to respiratory motion, coughing, despite repeating the study. No visible large or central pulmonary emboli. Heart is normal size. Aorta normal caliber. Mediastinum/Nodes: No mediastinal, hilar, or axillary adenopathy. Trachea and esophagus are unremarkable. Thyroid unremarkable. Lungs/Pleura: Extensive bilateral airspace disease compatible with COVID pneumonia. No effusions. Opacities most confluent in the lower lobes. Upper Abdomen: Imaging into the upper abdomen demonstrates no acute findings. Musculoskeletal: Chest Landa soft tissues are unremarkable. No acute bony abnormality. Review of the MIP images confirms the above findings. IMPRESSION: Severely limited study due to respiratory motion and coughing throughout the study. No visible large or central pulmonary emboli. Extensive  bilateral airspace disease compatible with COVID pneumonia. Electronically Signed   By: Rolm Baptise M.D.   On: 12/23/2020 23:55   DG Chest Port 1 View  Result Date: 12/23/2020 CLINICAL DATA:  Shortness of breath. Patient diagnosed with COVID-19 4 days ago. EXAM: PORTABLE CHEST 1 VIEW COMPARISON:  None. FINDINGS: Bilateral pulmonary infiltrates are identified. The heart is largely obscured by adjacent infiltrates. The hila and mediastinum are unremarkable. No pneumothorax. IMPRESSION: Significant bibasilar pulmonary infiltrates, likely due to COVID-19 pneumonia given history. Electronically Signed   By: Dorise Bullion III M.D   On: 12/23/2020 20:06   US PELVIC COMPLETE WITH TRANSVAGINAL  Result Date: 12/24/2020 CLINICAL DATA:  Initial evaluation for menorrhagia. EXAM: TRANSABDOMINAL AND TRANSVAGINAL ULTRASOUND OF PELVIS TECHNIQUE: Both transabdominal and transvaginal ultrasound examinations of the pelvis were performed. Transabdominal technique was performed for global imaging of the pelvis including uterus, ovaries, adnexal regions, and pelvic cul-de-sac. It was necessary to proceed with endovaginal exam following the transabdominal exam to  visualize the uterus, endometrium, and ovaries. COMPARISON:  None FINDINGS: Uterus Measurements: 17.3 x 11.6 x 13.0 cm = volume: 1371.5 mL. Multiple uterine fibroids are seen involving the uterus. 5.2 x 5.1 x 4.7 cm intramural fibroid present at the right uterine fundus. 5.2 x 5.3 x 4.3 cm intramural fibroid present at the mid uterine body. 6.1 x 6.0 x 5.5 cm intramural fibroid present at the left uterine fundus. Endometrium Not visualized or assessed due to overlying fibroids. Right ovary Not visualized.  No adnexal mass. Left ovary Measurements: 4.8 x 2.7 x 4.2 cm = volume: 28.7 mL. 2.4 x 2.2 x 3.2 cm simple cyst, likely a functional/physiologic follicular cyst. No internal complexity, vascularity, or solid nodularity. Other findings Trace free fluid within the pelvis,  presumably physiologic. IMPRESSION: 1. Enlarged fibroid uterus as above. 2. Endometrial stripe not visualized or assessed due to overlying fibroids. If there is continued clinical concern for a possible underlying occult endometrial pathology, then further evaluation with dedicated sonohysterography could be performed for further evaluation as warranted. 3. 3.2 cm simple right ovarian cyst, likely a normal physiologic follicular cyst. No follow up imaging recommended. Note: This recommendation does not apply to premenarchal patients or to those with increased risk (genetic, family history, elevated tumor markers or other high-risk factors) of ovarian cancer. Reference: Radiology 2019 Nov; 293(2):359-371. Electronically Signed   By: Jeannine Boga M.D.   On: 12/24/2020 03:44   VAS [place] LOWER EXTREMITY VENOUS (DVT)  Result Date: 12/24/2020  Lower Venous DVT Study Indications: D-Dimer.  Risk Factors: None identified. Comparison Study: No previous exams Performing Technologist: Vonzell Schlatter RVT  Examination Guidelines: A complete evaluation includes B-mode imaging, spectral Doppler, color Doppler, and power Doppler as needed of all accessible portions of each vessel. Bilateral testing is considered an integral part of a complete examination. Limited examinations for reoccurring indications may be performed as noted. The reflux portion of the exam is performed with the patient in reverse Trendelenburg.  +---------+---------------+---------+-----------+----------+--------------+ RIGHT    CompressibilityPhasicitySpontaneityPropertiesThrombus Aging +---------+---------------+---------+-----------+----------+--------------+ CFV      Full           Yes      Yes                                 +---------+---------------+---------+-----------+----------+--------------+ SFJ      Full                                                         +---------+---------------+---------+-----------+----------+--------------+ FV Prox  Full                                                        +---------+---------------+---------+-----------+----------+--------------+ FV Mid   Full                                                        +---------+---------------+---------+-----------+----------+--------------+ FV DistalFull                                                        +---------+---------------+---------+-----------+----------+--------------+  PFV      Full                                                        +---------+---------------+---------+-----------+----------+--------------+ POP      Full           Yes      Yes                                 +---------+---------------+---------+-----------+----------+--------------+ PTV      Full                                                        +---------+---------------+---------+-----------+----------+--------------+ PERO     Full                                                        +---------+---------------+---------+-----------+----------+--------------+   +---------+---------------+---------+-----------+----------+--------------+ LEFT     CompressibilityPhasicitySpontaneityPropertiesThrombus Aging +---------+---------------+---------+-----------+----------+--------------+ CFV      Full           Yes      Yes                                 +---------+---------------+---------+-----------+----------+--------------+ SFJ      Full                                                        +---------+---------------+---------+-----------+----------+--------------+ FV Prox  Full                                                        +---------+---------------+---------+-----------+----------+--------------+ FV Mid   Full                                                         +---------+---------------+---------+-----------+----------+--------------+ FV DistalFull                                                        +---------+---------------+---------+-----------+----------+--------------+ PFV      Full                                                        +---------+---------------+---------+-----------+----------+--------------+  POP      Full           Yes      Yes                                 +---------+---------------+---------+-----------+----------+--------------+ PTV      Full                                                        +---------+---------------+---------+-----------+----------+--------------+ PERO     Full                                                        +---------+---------------+---------+-----------+----------+--------------+     Summary: BILATERAL: - No evidence of deep vein thrombosis seen in the lower extremities, bilaterally. -No evidence of popliteal cyst, bilaterally.   *See table(s) above for measurements and observations. Electronically signed by Jamelle Haring on 12/24/2020 at 7:07:19 PM.    Final     Subjective: Feels well, still some dyspnea on exertion, but no hypoxia. No fevers. Really wants to go home. Menstrual bleeding is stable.  Discharge Exam: Vitals:   12/28/20 0831 12/28/20 1130  BP: (!) 124/57 124/67  Pulse: 65 63  Resp: 19 18  Temp: 98.6 F (37 C) 98 F (36.7 C)  SpO2: 95% 95%   General: Pt is alert, awake, not in acute distress Cardiovascular: RRR, S1/S2 +, no rubs, no gallops Respiratory: CTA bilaterally, no wheezing, no rhonchi Abdominal: Soft, NT, ND, bowel sounds + Extremities: No edema, no cyanosis  Labs: BNP (last 3 results) No results for input(s): BNP in the last 8760 hours. Basic Metabolic Panel: Recent Labs  Lab 12/23/20 2002 12/24/20 0900 12/25/20 0451 12/26/20 0516 12/27/20 0857 12/28/20 0404  NA 126*  --  136 138 140 140  K 4.2  --  4.1 4.3 4.0  3.4*  CL 91*  --  100 99 99 104  CO2 24  --  25 25 26 24   GLUCOSE 107*  --  172* 132* 137* 98  BUN 16  --  16 17 14 7   CREATININE 0.88  --  0.71 0.74 0.66 0.57  CALCIUM 8.1*  --  8.5* 9.2 9.2 8.5*  MG  --  2.2  --   --   --   --    Liver Function Tests: Recent Labs  Lab 12/23/20 2002 12/25/20 0451 12/26/20 0516 12/27/20 0857 12/28/20 0404  AST 32 22 22 25 22   ALT 14 12 14 18 16   ALKPHOS 58 54 57 58 48  BILITOT 0.8 0.6 0.4 0.6 0.4  PROT 6.3* 6.3* 6.8 6.8 5.6*  ALBUMIN 3.3* 3.3* 3.5 3.6 3.1*   Recent Labs  Lab 12/24/20 0900  LIPASE 25   No results for input(s): AMMONIA in the last 168 hours. CBC: Recent Labs  Lab 12/23/20 2002 12/24/20 0900 12/25/20 0451 12/26/20 0516 12/26/20 1142 12/27/20 0857 12/28/20 0404  WBC 2.7*  --  1.9* 2.9*  --  3.5* 5.4  NEUTROABS 1.8  --  0.9* 1.7  --  2.2 3.1  HGB 3.4*   < >  7.3* 7.9* 8.3* 8.3* 7.4*  HCT 13.9*   < > 26.5* 28.8* 29.9* 29.7* 26.6*  MCV 61.8*  --  76.8* 77.8*  --  76.5* 77.1*  PLT 313  --  238 229  --  195 170   < > = values in this interval not displayed.   Cardiac Enzymes: No results for input(s): CKTOTAL, CKMB, CKMBINDEX, TROPONINI in the last 168 hours. BNP: Invalid input(s): POCBNP CBG: Recent Labs  Lab 12/26/20 1643 12/26/20 2136 12/27/20 0817 12/27/20 1120 12/27/20 1700  GLUCAP 94 93 125* 93 141*   D-Dimer Recent Labs    12/27/20 0857 12/28/20 0404  DDIMER 2.89* 2.81*   Hgb A1c No results for input(s): HGBA1C in the last 72 hours. Lipid Profile No results for input(s): CHOL, HDL, LDLCALC, TRIG, CHOLHDL, LDLDIRECT in the last 72 hours. Thyroid function studies No results for input(s): TSH, T4TOTAL, T3FREE, THYROIDAB in the last 72 hours.  Invalid input(s): FREET3 Anemia work up No results for input(s): VITAMINB12, FOLATE, FERRITIN, TIBC, IRON, RETICCTPCT in the last 72 hours. Urinalysis No results found for: COLORURINE, APPEARANCEUR, LABSPEC, [place], GLUCOSEU, [place], [place],  [place], PROTEINUR, UROBILINOGEN, NITRITE, [place]  Microbiology Recent Results (from the past 240 hour(s))  Culture, blood (single)     Status: None   Collection Time: 12/23/20  8:16 PM   Specimen: BLOOD  Result Value Ref Range Status   Specimen Description   Final    BLOOD RIGHT ANTECUBITAL Performed at [facility] [address] [place], [place] [address]    Special Requests   Final    BOTTLES DRAWN AEROBIC AND ANAEROBIC Blood Culture adequate volume Performed at [facility] [address] [place], [place] [address]    Culture   Final    NO GROWTH 5 DAYS Performed at [facility], [place] [address] [place], [place] [address]    Report Status 12/28/2020 FINAL  Final  Resp Panel by RT-PCR (Flu A&B, Covid) Nasopharyngeal Swab     Status: Abnormal   Collection Time: 12/23/20  9:06 PM   Specimen: Nasopharyngeal Swab; Nasopharyngeal(NP) swabs in vial transport medium  Result Value Ref Range Status   SARS Coronavirus 2 by RT PCR POSITIVE (A) NEGATIVE Final    Comment: CRITICAL RESULT CALLED TO, READ BACK BY AND VERIFIED WITH: JERRY DAVIDSON RN 12/23/2020 @2310  [place] P.[facility] (NOTE) SARS-CoV-2 target nucleic acids are DETECTED.  The SARS-CoV-2 RNA is generally detectable in upper respiratory specimens during the acute phase of infection. Positive results are indicative of the presence of the identified virus, but do not rule out bacterial infection or co-infection with other pathogens not detected by the test. Clinical correlation with patient history and other diagnostic information is necessary to determine patient infection status. The expected result is Negative.  Fact Sheet for Patients: EntrepreneurPulse.com.au  Fact Sheet for Healthcare Providers: IncredibleEmployment.be  This test is not yet approved or cleared by the [place] FDA and  has been authorized for detection  and/or diagnosis of SARS-CoV-2 by FDA under an Emergency Use Authorization (EUA).  This EUA will remain in effect  (meaning this test can be used) for the duration of  the COVID-19 declaration under Section 564(b)(1) of the Act, 21 U.S.C. section 360bbb-3(b)(1), unless the authorization is terminated or revoked sooner.     Influenza A by PCR NEGATIVE NEGATIVE Final   Influenza B by PCR NEGATIVE NEGATIVE Final    Comment: (NOTE) The Xpert Xpress SARS-CoV-2/FLU/RSV plus assay is intended as  an aid in the diagnosis of influenza from Nasopharyngeal swab specimens and should not be used as a sole basis for treatment. Nasal washings and aspirates are unacceptable for Xpert Xpress SARS-CoV-2/FLU/RSV testing.  Fact Sheet for Patients: EntrepreneurPulse.com.au  Fact Sheet for Healthcare Providers: IncredibleEmployment.be  This test is not yet approved or cleared by the [place] FDA and has been authorized for detection and/or diagnosis of SARS-CoV-2 by FDA under an Emergency Use Authorization (EUA). This EUA will remain in effect (meaning this test can be used) for the duration of the COVID-19 declaration under Section 564(b)(1) of the Act, 21 U.S.C. section 360bbb-3(b)(1), unless the authorization is terminated or revoked.  Performed at [facility], [place] [address], [place], [place] [address]     Time coordinating discharge: Approximately 40 minutes  Patrecia Pour, MD  [facility] 12/28/2020, 12:09 PM

## 2020-12-28 NOTE — Progress Notes (Addendum)
Physical Therapy Treatment Patient Details Name: Caitlyn King MRN: [id number] DOB: 1986-07-19 Today's Date: 12/28/2020    History of Present Illness Caitlyn King is a 35 y.o. female with medical history significant of difficulty hearing, obesity .She had a positive outpatient Covid test on 12/19/2020.  She was seen in the ED on 1/6 for cough and shortness of breath.  She was discharged with referral for monoclonal antibody infusion.  Presenting to the ED1/9/22  with complaints of worsening shortness of breath and fatigue. CT angiogram chest  showing no visible large or central PE.  Showing extensive bilateral airspace disease compatible with COVID-19 pneumonia    PT Comments    Pt tired upon arrival, agreeable to ambulate and exercise but declines getting up to chair at EOS due to wanting to nap. Pt ambulates around room, clearing past obstacles, completing 180 degree turns, performs head turned R/L without LOB, maintaining slightly wide BOS. Pt completes 5x STS test in 14 sec. Pt declines additional exercise due to wanting to take a nap. Mild SOB noted with activity, pt desats to 86% on RA, improves to 90% within 1 minute of seated pursed lip breathing; RN notified. Continue acute PT to increase strength, balance, endurance for safe ADLs and gait.    Follow Up Recommendations  No PT follow up     Equipment Recommendations  None recommended by PT    Recommendations for Other Services       Precautions / Restrictions Precautions Precautions: Fall Precaution Comments: moniltor sats/HR Restrictions Weight Bearing Restrictions: No    Mobility  Bed Mobility Overal bed mobility: Modified Independent Bed Mobility: Supine to Sit;Sit to Supine  Supine to sit: Modified independent (Device/Increase time) Sit to supine: Modified independent (Device/Increase time)   General bed mobility comments: slightly increased time with use of bedrail to upright trunk and scoot out to EOB  Transfers    Equipment used: None Transfers: Sit to/from Stand Sit to Stand: Supervision  General transfer comment: rises from EOB with BUE assisting to power up, good steadiness  Ambulation/Gait Ambulation/Gait assistance: Supervision Gait Distance (Feet): 40 Feet Assistive device: None Gait Pattern/deviations: Step-through pattern;Decreased stride length;Wide base of support Gait velocity: slightly decreased   General Gait Details: pt ambulates around room on RA, completes turns without LOB, able to clear past furniture without unsteadiness, mild breathing noted   Stairs             Wheelchair Mobility    Modified Rankin (Stroke Patients Only)       Balance Overall balance assessment: Mild deficits observed, not formally tested       Cognition Arousal/Alertness: Awake/alert Behavior During Therapy: WFL for tasks assessed/performed Overall Cognitive Status: Within Functional Limits for tasks assessed         Exercises Other Exercises Other Exercises: 5x STS from EOB, going for speed, 14 seconds, SUPV    General Comments General comments (skin integrity, edema, etc.): Pt on RA desats to 86% with ambulation and STS reps but recovers with seated pursed lip breathing to 90% within 1 minute and DOE 1/4, 92% at EOS on RA      Pertinent Vitals/Pain Pain Assessment: No/denies pain    Home Living                      Prior Function            PT Goals (current goals can now be found in the care plan section) Acute Rehab PT  Goals Patient Stated Goal: to go home PT Goal Formulation: With patient Time For Goal Achievement: 01/07/21 Potential to Achieve Goals: Good Progress towards PT goals: Progressing toward goals    Frequency    Min 3X/week      PT Plan Current plan remains appropriate    Co-evaluation              AM-PAC PT "6 Clicks" Mobility   Outcome Measure  Help needed turning from your back to your side while in a flat bed without  using bedrails?: None Help needed moving from lying on your back to sitting on the side of a flat bed without using bedrails?: None Help needed moving to and from a bed to a chair (including a wheelchair)?: None Help needed standing up from a chair using your arms (e.g., wheelchair or bedside chair)?: None Help needed to walk in hospital room?: None Help needed climbing 3-5 steps with a railing? : A Little 6 Click Score: 23    End of Session   Activity Tolerance: Patient tolerated treatment well Patient left: in bed;with call bell/phone within reach Nurse Communication: Mobility status PT Visit Diagnosis: Unsteadiness on feet (R26.81);Difficulty in walking, not elsewhere classified (R26.2)     Time: 1937-9024 PT Time Calculation (min) (ACUTE ONLY): 10 min  Charges:  $Gait Training: 8-22 mins                      Tori Eevie Lapp PT, DPT 12/28/20, 10:16 AM

## 2020-12-29 LAB — TYPE AND SCREEN
ABO/RH(D): O POS
Antibody Screen: NEGATIVE
Unit division: 0

## 2020-12-29 LAB — BPAM RBC
Blood Product Expiration Date: 202202162359
ISSUE DATE / TIME: 202201141147
Unit Type and Rh: 5100

## 2021-10-21 ENCOUNTER — Telehealth: Payer: Self-pay | Admitting: Hematology and Oncology

## 2021-10-21 NOTE — Telephone Encounter (Signed)
Scheduled appt per 11/7 referral. Pt is aware of appt date and time.

## 2021-11-03 NOTE — Progress Notes (Signed)
[facility] CONSULT NOTE  Patient Care Team: Patient, No Pcp Per (Inactive) as PCP - General (General Practice)  CHIEF COMPLAINTS/PURPOSE OF CONSULTATION:  Newly diagnosed anemia  HISTORY OF PRESENTING ILLNESS:  Caitlyn King 35 y.o. female is here because of recent diagnosis of anemia. Labs on 10/18/2021 showed Hg 4.9, HCT 21.0, MCV 65. She presents to the clinic today for initial evaluation and discussion of treatment options.  She was diagnosed with iron deficiency in January 2022 when she was admitted with COVID-pneumonia.  She was given 5 units of PRBC and the iron deficiency was felt to be related to heavy uterine bleeding.  She had seen a gynecologist recently who did blood work and detected that her hemoglobin was down to 4.9 and wanted her to go to the emergency room but she was able to make an appointment with [place] to be seen.  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  She does have mild fatigue.  Does not have any dizziness or shortness of breath to exertion.  She has profound ice chip cravings.  I reviewed her records extensively and collaborated the history with the patient.  MEDICAL HISTORY:  Past Medical History:  Diagnosis Date   Hard of hearing     SURGICAL HISTORY: No past surgical history on file.  SOCIAL HISTORY: Social History   Socioeconomic History   Marital status: Single    Spouse name: Not on file   Number of children: Not on file   Years of education: Not on file   Highest education level: Not on file  Occupational History   Not on file  Tobacco Use   Smoking status: Never   Smokeless tobacco: Never  Substance and Sexual Activity   Alcohol use: Never   Drug use: Not on file   Sexual activity: Not on file  Other Topics Concern   Not on file  Social History Narrative   Not on file   Social Determinants of Health   Financial Resource Strain: Not on file  Food Insecurity: Not on file  Transportation Needs:  Not on file  Physical Activity: Not on file  Stress: Not on file  Social Connections: Not on file  Intimate Partner Violence: Not on file    FAMILY HISTORY: No family history on file.  ALLERGIES:  has No Known Allergies.  MEDICATIONS:  No current outpatient medications on file.   No current facility-administered medications for this visit.    REVIEW OF SYSTEMS:   Constitutional: Mild fatigue Behavioral/Psych: Mood is stable, no new changes  All other systems were reviewed with the patient and are negative.  PHYSICAL EXAMINATION: ECOG PERFORMANCE STATUS: 1 - Symptomatic but completely ambulatory  Vitals:   11/04/21 1300 11/04/21 1301  BP: (!) 130/44 (!) 116/54  Pulse: 92   Resp: 18   Temp: 97.7 F (36.5 C)   SpO2: 100%    Filed Weights   11/04/21 1300  Weight: 266 lb 8 oz (120.9 kg)     LABORATORY DATA:  I have reviewed the data as listed Lab Results  Component Value Date   WBC 5.4 12/28/2020   HGB 7.4 (L) 12/28/2020   HCT 26.6 (L) 12/28/2020   MCV 77.1 (L) 12/28/2020   PLT 170 12/28/2020   Lab Results  Component Value Date   NA 140 12/28/2020   K 3.4 (L) 12/28/2020   CL 104 12/28/2020   CO2 24 12/28/2020    RADIOGRAPHIC STUDIES: I  have personally reviewed the radiological reports and agreed with the findings in the report.  ASSESSMENT AND PLAN:  Iron deficiency anemia due to chronic blood loss Profound iron deficiency anemia 12/23/2020: Hemoglobin 3.4, MCV 61.8, WBC 2.7, platelets 313, ANC 1.8 (iron deficiency anemia due to uterine fibroids causing heavy menstrual bleeding: 5 units of PRBC) hospitalization for COVID-pneumonia 12/23/2020-12/28/2018  10/18/2021: Hemoglobin 4.9, MCV 65, RDW 23.8, platelets 122  Recommendation: IV iron treatment,  Based on lack of severe symptoms, I did not recommend a blood transfusion at this time.  Gynecology follow-up for uterine bleeding Recheck labs 1 month after iron is finished. She might need additional IV iron  in the future.   All questions were answered. The patient knows to call the clinic with any problems, questions or concerns.   Rulon Eisenmenger, MD, MPH 11/04/2021    I, Thana Ates, am acting as scribe for Nicholas Lose, MD.  I have reviewed the above documentation for accuracy and completeness, and I agree with the above.

## 2021-11-04 ENCOUNTER — Inpatient Hospital Stay: Payer: BC Managed Care – PPO | Attending: Hematology and Oncology | Admitting: Hematology and Oncology

## 2021-11-04 ENCOUNTER — Other Ambulatory Visit: Payer: Self-pay

## 2021-11-04 DIAGNOSIS — D500 Iron deficiency anemia secondary to blood loss (chronic): Secondary | ICD-10-CM | POA: Insufficient documentation

## 2021-11-04 DIAGNOSIS — R5383 Other fatigue: Secondary | ICD-10-CM | POA: Insufficient documentation

## 2021-11-04 DIAGNOSIS — N939 Abnormal uterine and vaginal bleeding, unspecified: Secondary | ICD-10-CM | POA: Diagnosis not present

## 2021-11-04 DIAGNOSIS — Z8616 Personal history of COVID-19: Secondary | ICD-10-CM | POA: Insufficient documentation

## 2021-11-04 DIAGNOSIS — Z86018 Personal history of other benign neoplasm: Secondary | ICD-10-CM | POA: Insufficient documentation

## 2021-11-04 NOTE — Assessment & Plan Note (Signed)
Profound iron deficiency anemia 12/23/2020: Hemoglobin 3.4, MCV 61.8, WBC 2.7, platelets 313, ANC 1.8 (iron deficiency anemia due to uterine fibroids causing heavy menstrual bleeding: 5 units of PRBC) hospitalization for COVID-pneumonia 12/23/2020-12/28/2018  10/18/2021: Hemoglobin 4.9, MCV 65, RDW 23.8, platelets 122  Recommendation: IV iron treatment, blood transfusion Gynecology follow-up for uterine bleeding Recheck labs 1 month after iron is finished.

## 2021-11-06 ENCOUNTER — Telehealth: Payer: Self-pay | Admitting: *Deleted

## 2021-11-06 NOTE — Telephone Encounter (Signed)
Received message from [place] team stating PA has not been obtained as of yet for IV iron.  States pt insurance provider is closed tomorrow and Friday.  Pt is currently scheduled for first dose this Saturday 11/09/21.  Per MD appt needing to be canceled and pushed out until PA is obtained.  RN attempt x1 to contact pt regarding scheduling situation and PA status.  No answer, LVM to return call to the office.

## 2021-11-09 ENCOUNTER — Inpatient Hospital Stay: Payer: BC Managed Care – PPO

## 2021-11-15 MED FILL — Iron Sucrose Inj 20 MG/ML (Fe Equiv): INTRAVENOUS | Qty: 15 | Status: AC

## 2021-11-16 ENCOUNTER — Inpatient Hospital Stay: Payer: BC Managed Care – PPO | Attending: Hematology and Oncology

## 2021-11-16 ENCOUNTER — Other Ambulatory Visit: Payer: Self-pay

## 2021-11-16 VITALS — BP 106/59 | HR 75 | Temp 98.0°F | Resp 18

## 2021-11-16 DIAGNOSIS — D509 Iron deficiency anemia, unspecified: Secondary | ICD-10-CM | POA: Diagnosis not present

## 2021-11-16 DIAGNOSIS — D500 Iron deficiency anemia secondary to blood loss (chronic): Secondary | ICD-10-CM

## 2021-11-16 MED ORDER — SODIUM CHLORIDE 0.9 % IV SOLN
300.0000 mg | Freq: Once | INTRAVENOUS | Status: AC
  Administered 2021-11-16: 300 mg via INTRAVENOUS
  Filled 2021-11-16: qty 300

## 2021-11-16 MED ORDER — SODIUM CHLORIDE 0.9 % IV SOLN: Freq: Once | INTRAVENOUS | Status: AC

## 2021-11-16 MED ORDER — ACETAMINOPHEN 325 MG PO TABS
650.0000 mg | ORAL_TABLET | Freq: Once | ORAL | Status: AC
  Administered 2021-11-16: 650 mg via ORAL
  Filled 2021-11-16: qty 2

## 2021-11-16 NOTE — Patient Instructions (Signed)

## 2021-11-16 NOTE — Progress Notes (Signed)
Pt is a first time Venofer. Pt tolerated IV iron well. Advised to call with any concerns. AVS was given with pt education. Pt verbalized understanding. Ambulated out of facility

## 2021-11-22 MED FILL — Iron Sucrose Inj 20 MG/ML (Fe Equiv): INTRAVENOUS | Qty: 15 | Status: AC

## 2021-11-23 ENCOUNTER — Other Ambulatory Visit: Payer: Self-pay

## 2021-11-23 ENCOUNTER — Inpatient Hospital Stay: Payer: BC Managed Care – PPO

## 2021-11-23 VITALS — BP 98/48 | HR 73 | Temp 97.5°F | Resp 16

## 2021-11-23 DIAGNOSIS — D509 Iron deficiency anemia, unspecified: Secondary | ICD-10-CM | POA: Diagnosis not present

## 2021-11-23 DIAGNOSIS — D500 Iron deficiency anemia secondary to blood loss (chronic): Secondary | ICD-10-CM

## 2021-11-23 MED ORDER — SODIUM CHLORIDE 0.9 % IV SOLN
Freq: Once | INTRAVENOUS | Status: AC
Start: 2021-11-23 — End: 2021-11-23

## 2021-11-23 MED ORDER — ACETAMINOPHEN 325 MG PO TABS
650.0000 mg | ORAL_TABLET | Freq: Once | ORAL | Status: AC
  Administered 2021-11-23: 650 mg via ORAL
  Filled 2021-11-23: qty 2

## 2021-11-23 MED ORDER — SODIUM CHLORIDE 0.9 % IV SOLN
300.0000 mg | Freq: Once | INTRAVENOUS | Status: AC
  Administered 2021-11-23: 300 mg via INTRAVENOUS
  Filled 2021-11-23: qty 10

## 2021-11-23 NOTE — Patient Instructions (Signed)
[facility] ONCOLOGY  Discharge Instructions: Thank you for choosing [facility] to provide your oncology and hematology care.   If you have a lab appointment with the [facility], please go directly to the [facility] and check in at the registration area.   Wear comfortable clothing and clothing appropriate for easy access to any Portacath or PICC line.   We strive to give you quality time with your provider. You may need to reschedule your appointment if you arrive late (15 or more minutes).  Arriving late affects you and other patients whose appointments are after yours.  Also, if you miss three or more appointments without notifying the office, you may be dismissed from the clinic at the provider's discretion.      For prescription refill requests, have your pharmacy contact our office and allow 72 hours for refills to be completed.    Today you received the following-Iron infusion    To help prevent nausea and vomiting after your treatment, we encourage you to take your nausea medication as directed.  BELOW ARE SYMPTOMS THAT SHOULD BE REPORTED IMMEDIATELY: *FEVER GREATER THAN 100.4 F (38 C) OR HIGHER *CHILLS OR SWEATING *NAUSEA AND VOMITING THAT IS NOT CONTROLLED WITH YOUR NAUSEA MEDICATION *UNUSUAL SHORTNESS OF BREATH *UNUSUAL BRUISING OR BLEEDING *URINARY PROBLEMS (pain or burning when urinating, or frequent urination) *BOWEL PROBLEMS (unusual diarrhea, constipation, pain near the anus) TENDERNESS IN MOUTH AND THROAT WITH OR WITHOUT PRESENCE OF ULCERS (sore throat, sores in mouth, or a toothache) UNUSUAL RASH, SWELLING OR PAIN  UNUSUAL VAGINAL DISCHARGE OR ITCHING   Items with * indicate a potential emergency and should be followed up as soon as possible or go to the Emergency Department if any problems should occur.  Please show the CHEMOTHERAPY ALERT CARD or IMMUNOTHERAPY ALERT CARD at check-in to the Emergency Department and triage  nurse.  Should you have questions after your visit or need to cancel or reschedule your appointment, please contact [facility]  Dept: [phone]  and follow the prompts.  Office hours are 8:00 a.m. to 4:30 p.m. Monday - Friday. Please note that voicemails left after 4:00 p.m. may not be returned until the following business day.  We are closed weekends and major holidays. You have access to a nurse at all times for urgent questions. Please call the main number to the clinic Dept: [phone] and follow the prompts.   For any non-urgent questions, you may also contact your provider using MyChart. We now offer e-Visits for anyone 68 and older to request care online for non-urgent symptoms. For details visit [email or web address].   Also download the MyChart app! Go to the app store, search "MyChart", open the app, select [facility], and log in with your MyChart username and password.  Due to Covid, a mask is required upon entering the hospital/clinic. If you do not have a mask, one will be given to you upon arrival. For doctor visits, patients may have 1 support person aged 36 or older with them. For treatment visits, patients cannot have anyone with them due to current Covid guidelines and our immunocompromised population.

## 2021-11-29 ENCOUNTER — Ambulatory Visit: Payer: BC Managed Care – PPO

## 2021-11-29 MED FILL — Iron Sucrose Inj 20 MG/ML (Fe Equiv): INTRAVENOUS | Qty: 15 | Status: AC

## 2021-11-30 ENCOUNTER — Inpatient Hospital Stay: Payer: BC Managed Care – PPO

## 2021-11-30 ENCOUNTER — Other Ambulatory Visit: Payer: Self-pay

## 2021-11-30 VITALS — BP 115/56 | HR 66 | Temp 97.8°F | Resp 16

## 2021-11-30 DIAGNOSIS — D500 Iron deficiency anemia secondary to blood loss (chronic): Secondary | ICD-10-CM

## 2021-11-30 DIAGNOSIS — D509 Iron deficiency anemia, unspecified: Secondary | ICD-10-CM | POA: Diagnosis not present

## 2021-11-30 MED ORDER — SODIUM CHLORIDE 0.9 % IV SOLN
300.0000 mg | Freq: Once | INTRAVENOUS | Status: AC
  Administered 2021-11-30: 300 mg via INTRAVENOUS
  Filled 2021-11-30: qty 300

## 2021-11-30 MED ORDER — SODIUM CHLORIDE 0.9 % IV SOLN: Freq: Once | INTRAVENOUS | Status: AC

## 2021-11-30 MED ORDER — ACETAMINOPHEN 325 MG PO TABS
650.0000 mg | ORAL_TABLET | Freq: Once | ORAL | Status: AC
Start: 2021-11-30 — End: 2021-11-30
  Administered 2021-11-30: 650 mg via ORAL
  Filled 2021-11-30: qty 2

## 2021-11-30 NOTE — Patient Instructions (Signed)

## 2021-12-24 ENCOUNTER — Telehealth: Payer: Self-pay | Admitting: Hematology and Oncology

## 2021-12-24 ENCOUNTER — Other Ambulatory Visit: Payer: Self-pay

## 2021-12-24 ENCOUNTER — Other Ambulatory Visit: Payer: Self-pay | Admitting: *Deleted

## 2021-12-24 DIAGNOSIS — D500 Iron deficiency anemia secondary to blood loss (chronic): Secondary | ICD-10-CM

## 2021-12-24 NOTE — Telephone Encounter (Signed)
Sch per 1/1- inbasket, left msg

## 2021-12-25 ENCOUNTER — Inpatient Hospital Stay: Payer: BC Managed Care – PPO

## 2021-12-27 ENCOUNTER — Telehealth: Payer: BC Managed Care – PPO | Admitting: Hematology and Oncology

## 2022-01-06 ENCOUNTER — Inpatient Hospital Stay: Payer: BC Managed Care – PPO | Attending: Hematology and Oncology

## 2022-01-07 NOTE — Assessment & Plan Note (Signed)
12/23/2020: Hemoglobin 3.4, MCV 61.8, WBC 2.7, platelets 313, ANC 1.8 (iron deficiency anemia due to uterine fibroids causing heavy menstrual bleeding: 5 units of PRBC) hospitalization for COVID-pneumonia 12/23/2020-12/28/2018  10/18/2021: Hemoglobin 4.9, MCV 65, RDW 23.8, platelets 122 Needs labs  Recommendation: IV iron treatment,  Based on lack of severe symptoms, I did not recommend a blood transfusion at this time.  Gynecology follow-up for uterine bleeding

## 2022-01-07 NOTE — Progress Notes (Signed)
°  HEMATOLOGY-ONCOLOGY TELEPHONE VISIT PROGRESS NOTE  I connected with Caitlyn King on 01/08/2022 at  8:15 AM EST by telephone and verified that I am speaking with the correct person using two identifiers.  I discussed the limitations, risks, security and privacy concerns of performing an evaluation and management service by telephone and the availability of in person appointments.  I also discussed with the patient that there may be a patient responsible charge related to this service. The patient expressed understanding and agreed to proceed.   History of Present Illness: Caitlyn King is a 36 y.o. female with above-mentioned history of anemia. She presents via telephone today for follow-up.  Unfortunately she did not get labs done because she lost insurance as soon as she lost her job.  She did feel remarkably better after she received IV iron.  However she continues to have heavy menstrual cycles.  Observations/Objective:    Assessment Plan:  Iron deficiency anemia due to chronic blood loss 12/23/2020: Hemoglobin 3.4, MCV 61.8, WBC 2.7, platelets 313, ANC 1.8 (iron deficiency anemia due to uterine fibroids causing heavy menstrual bleeding: 5 units of PRBC) hospitalization for COVID-pneumonia 12/23/2020-12/28/2018   10/18/2021: Hemoglobin 4.9, MCV 65, RDW 23.8, platelets 122 Needs labs   Recommendation: IV iron treatment December 2022 Unfortunately she does not have any a job currently and therefore does not have insurance and therefore she did not do her lab work.   She still continues to have heavy menstrual cycles. I am worried that she might end up in the emergency room with severe anemia and become hospitalized. She assured me that she will find a job and try to get the lab work done as soon as possible. Gynecology follow-up for uterine bleeding   I discussed the assessment and treatment plan with the patient. The patient was provided an opportunity to ask questions and all were answered.  The patient agreed with the plan and demonstrated an understanding of the instructions. The patient was advised to call back or seek an in-person evaluation if the symptoms worsen or if the condition fails to improve as anticipated.   Total time spent: 11 mins including non-face to face time and time spent for planning, charting and coordination of care  Rulon Eisenmenger, MD 01/08/2022    I, Thana Ates, am acting as scribe for Nicholas Lose, MD.  I have reviewed the above documentation for accuracy and completeness, and I agree with the above.

## 2022-01-08 ENCOUNTER — Inpatient Hospital Stay (HOSPITAL_BASED_OUTPATIENT_CLINIC_OR_DEPARTMENT_OTHER): Payer: BC Managed Care – PPO | Admitting: Hematology and Oncology

## 2022-01-08 DIAGNOSIS — D500 Iron deficiency anemia secondary to blood loss (chronic): Secondary | ICD-10-CM

## 2022-02-12 IMAGING — CT CT ANGIO CHEST
2 of 12 series · 11 of 38 positions shown · IV contrast (omnipaque)
Comparison: None.

CLINICAL DATA: Cough, COVID

EXAM:
CT ANGIOGRAPHY CHEST WITH CONTRAST
TECHNIQUE: Multidetector CT imaging of the chest was performed using the
standard protocol during bolus administration of intravenous
contrast. Multiplanar CT image reconstructions and MIPs were
obtained to evaluate the vascular anatomy.
CONTRAST:  100mL OMNIPAQUE IOHEXOL 350 MG/ML SOLN

[Series 4: axial st · axial · 0.82mm/px · z∈[-208,-92]mm · 3 of 116 slices shown]
[im 29/116  lung]
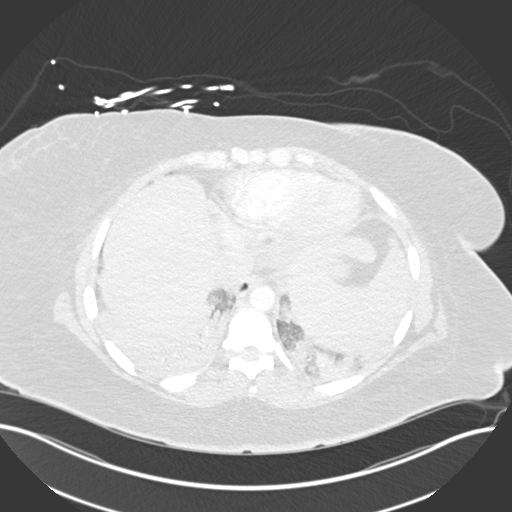
[im 58/116  lung]
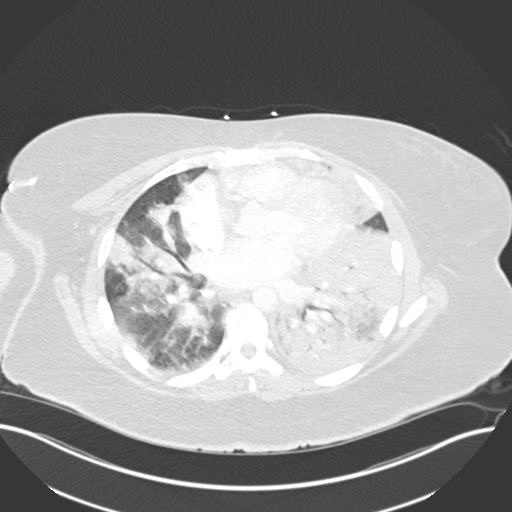
[im 87/116  lung]
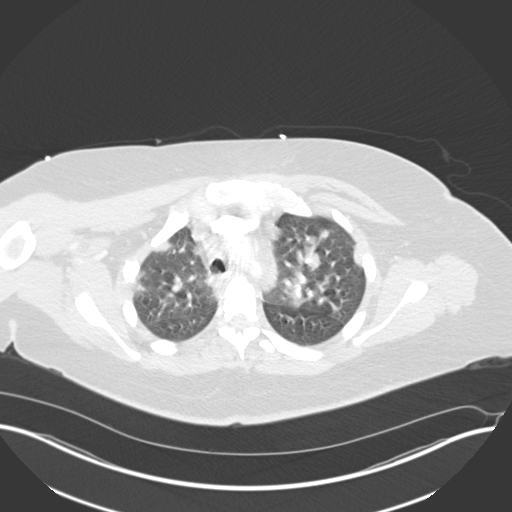

[Series 5: thins · axial · 0.82mm/px · z∈[-239,-60]mm · 8 of 231 slices shown]
[im 26/231  lung]
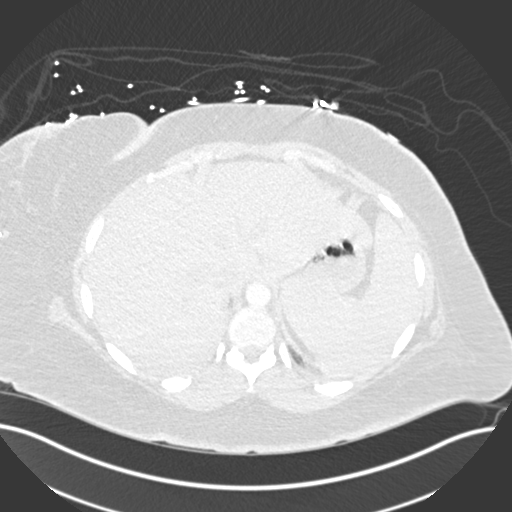
[im 52/231  mediastinal]
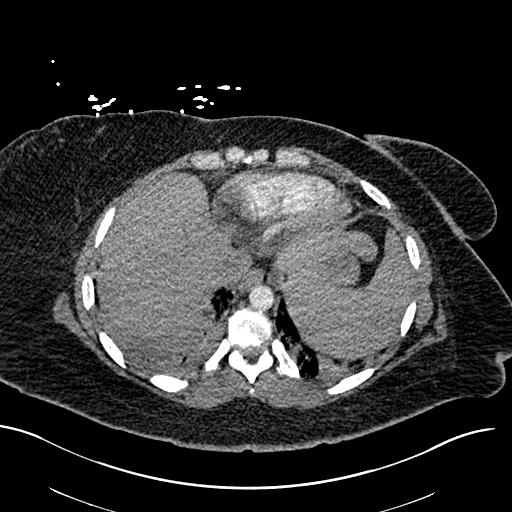
[im 77/231  lung]
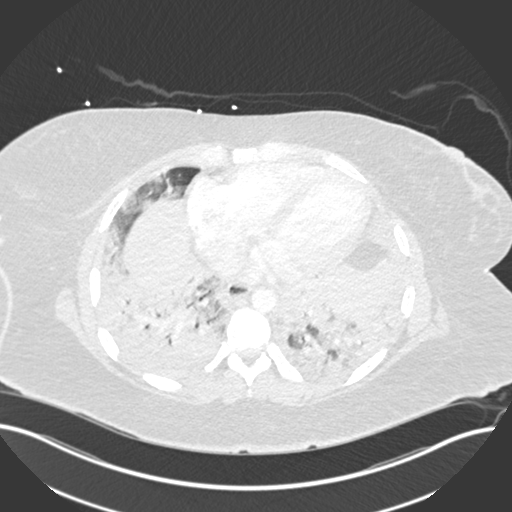
[im 103/231  mediastinal]
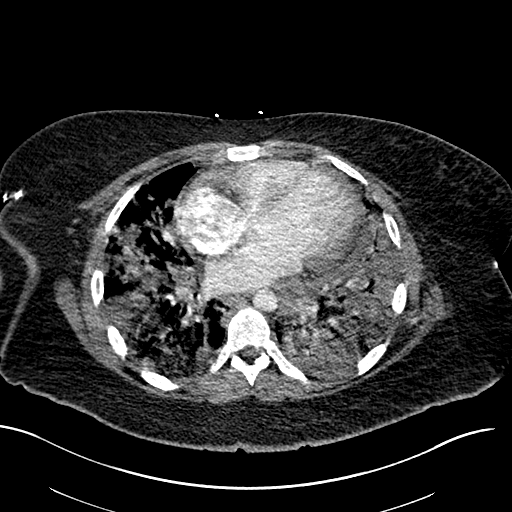
[im 128/231  lung]
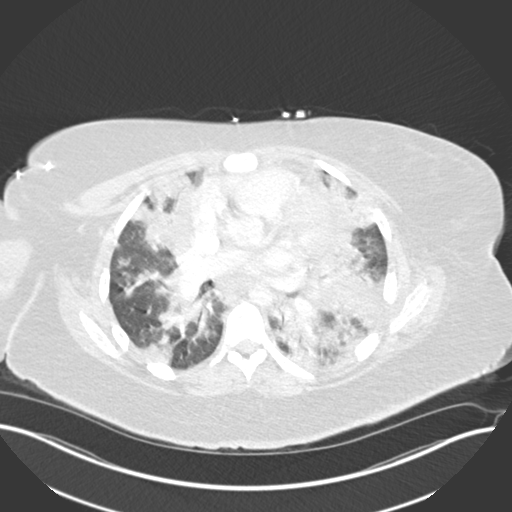
[im 154/231  mediastinal]
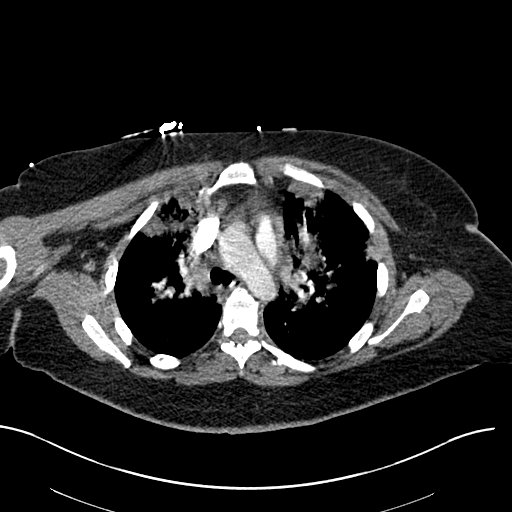
[im 179/231  lung]
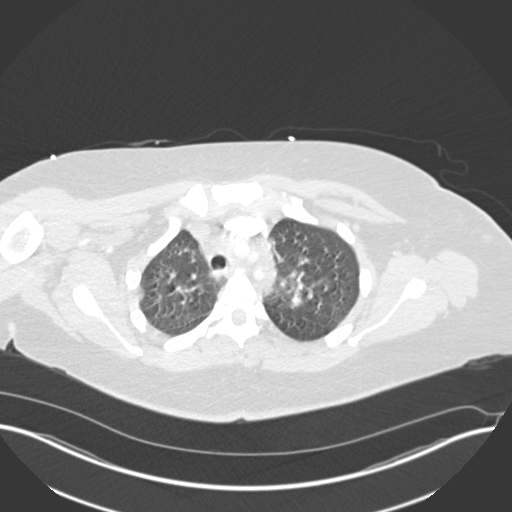
[im 205/231  mediastinal]
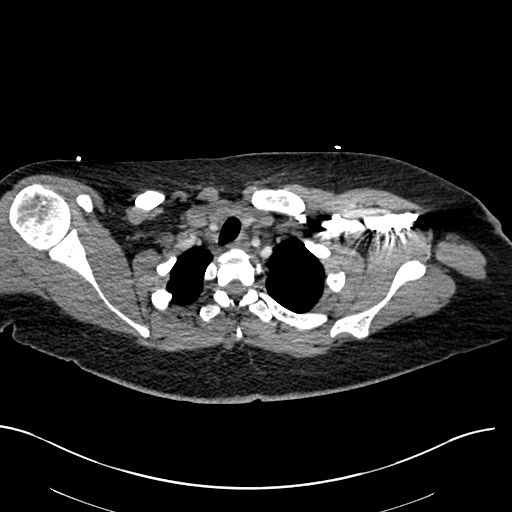

[11 of 38 positions shown; findings below may reference images not displayed]

FINDINGS: Cardiovascular: Study severely limited due to respiratory motion,
coughing, despite repeating the study. No visible large or central
pulmonary emboli. Heart is normal size. Aorta normal caliber.

Mediastinum/Nodes: No mediastinal, hilar, or axillary adenopathy.
Trachea and esophagus are unremarkable. Thyroid unremarkable.

Lungs/Pleura: Extensive bilateral airspace disease compatible with
COVID pneumonia. No effusions. Opacities most confluent in the lower
lobes.

Upper Abdomen: Imaging into the upper abdomen demonstrates no acute
findings.

Musculoskeletal: Chest wall soft tissues are unremarkable. No acute
bony abnormality.

Review of the MIP images confirms the above findings.
IMPRESSION: Severely limited study due to respiratory motion and coughing
throughout the study. No visible large or central pulmonary emboli.

Extensive bilateral airspace disease compatible with COVID
pneumonia.

## 2022-02-12 IMAGING — DX DG CHEST 1V PORT
1 series · 1 of 1 positions shown · non-contrast
Comparison: None.

CLINICAL DATA: Shortness of breath. Patient diagnosed with [id number]
4 days ago.

EXAM:
PORTABLE CHEST 1 VIEW

[chest ap]
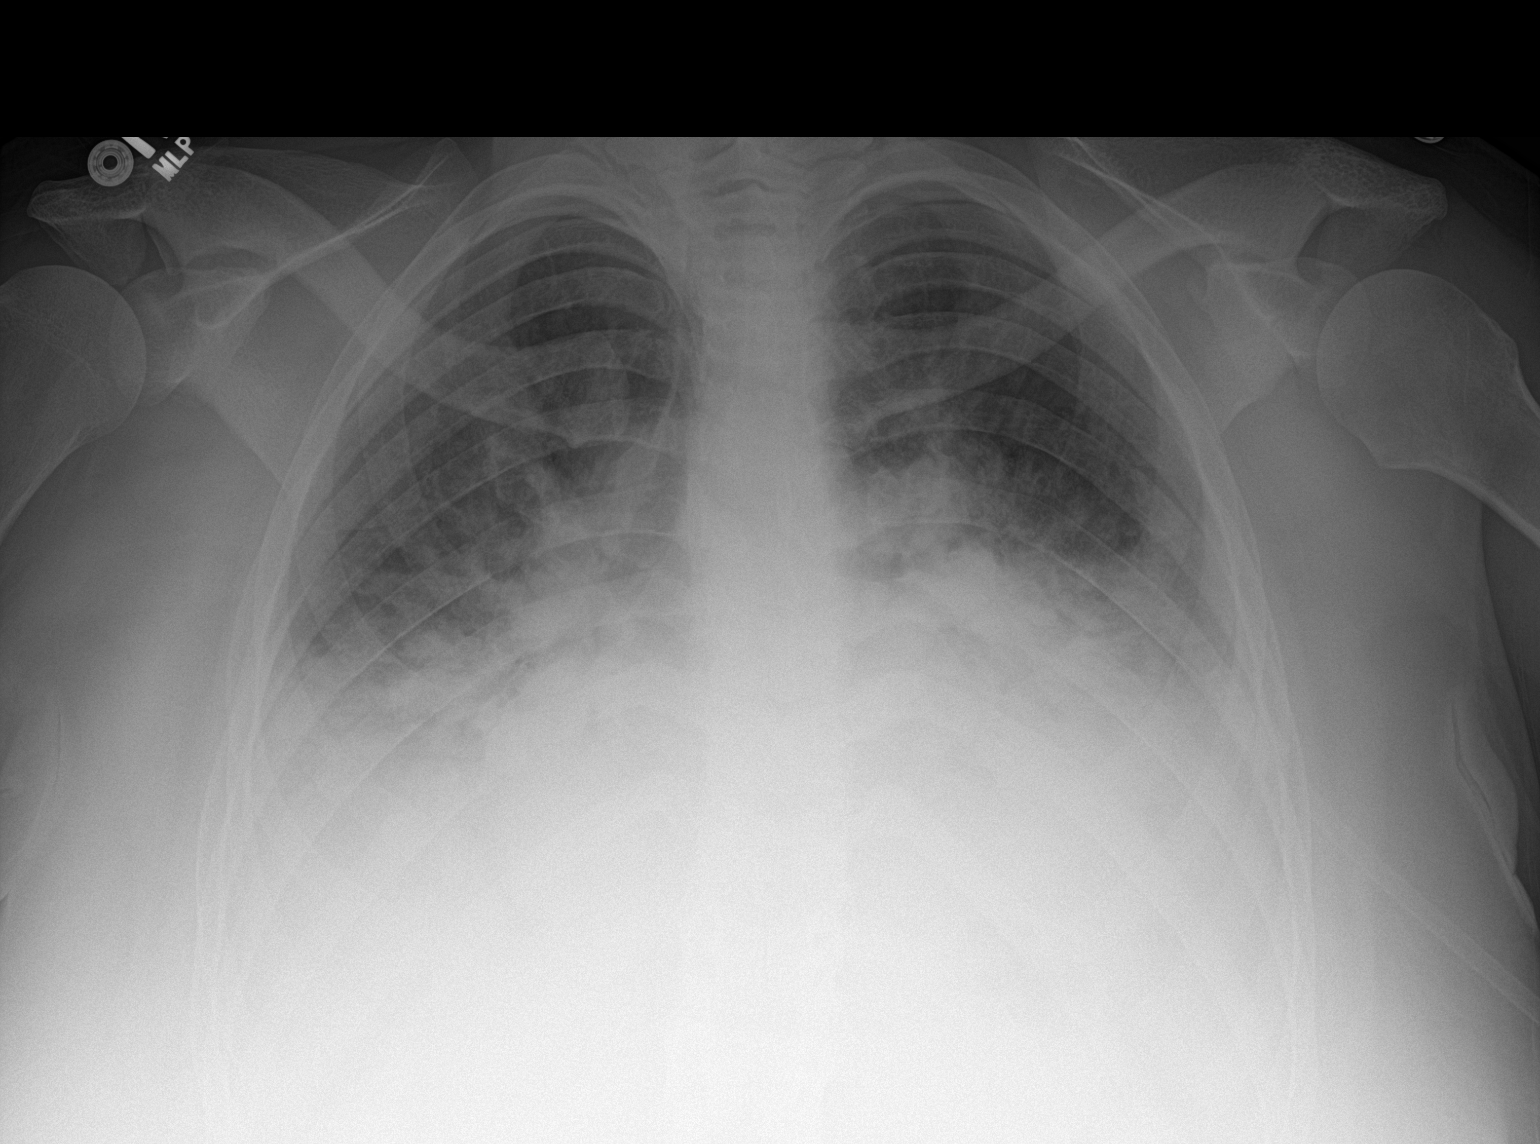

[1 of 1 positions shown; findings below may reference images not displayed]

FINDINGS: Bilateral pulmonary infiltrates are identified. The heart is largely
obscured by adjacent infiltrates. The hila and mediastinum are
unremarkable. No pneumothorax.
IMPRESSION: Significant bibasilar pulmonary infiltrates, likely due to CZA7M-DV
pneumonia given history.

## 2022-02-13 IMAGING — US US PELVIS COMPLETE WITH TRANSVAGINAL
1 series · 13 of 25 positions shown · non-contrast
Comparison: None

CLINICAL DATA: Initial evaluation for menorrhagia.



[Series 1: us pelvis complete with transvaginal · 13 of 66 slices shown]
[im 1/66]
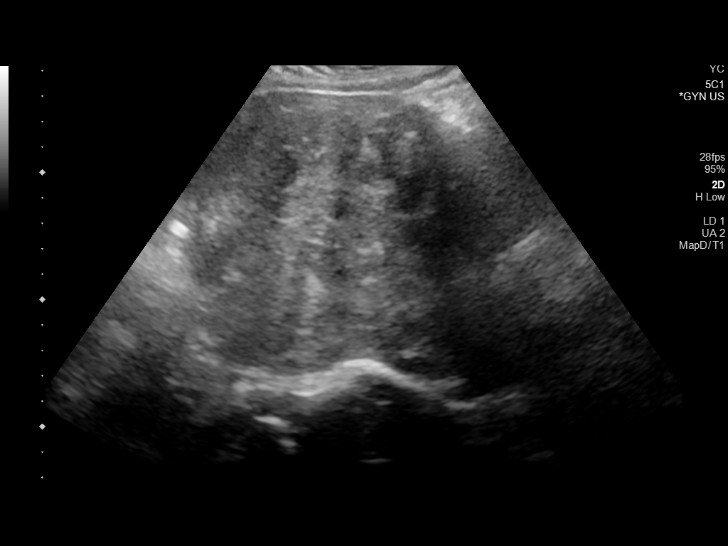
[im 6/66]
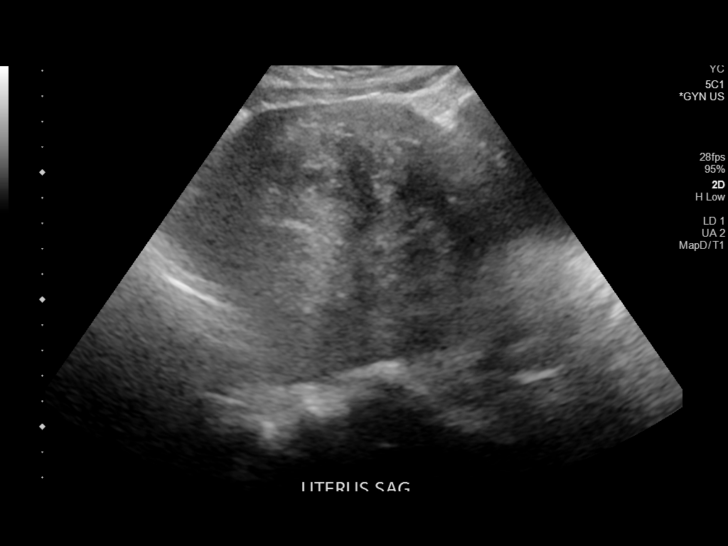
[im 11/66]
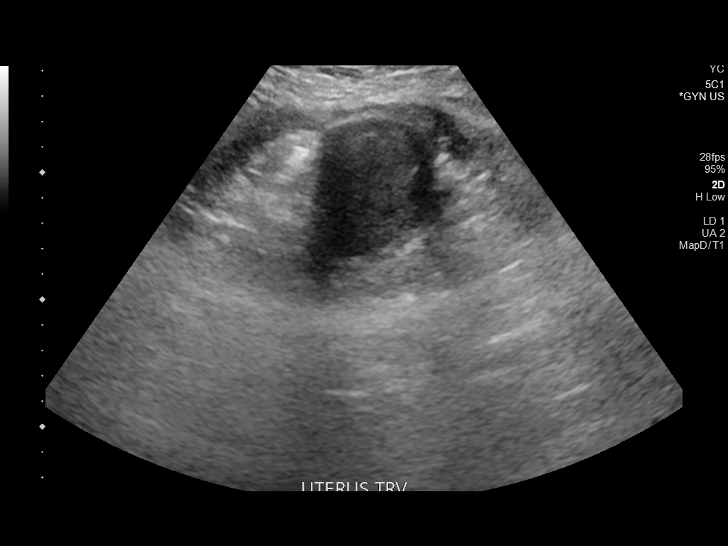
[im 17/66]
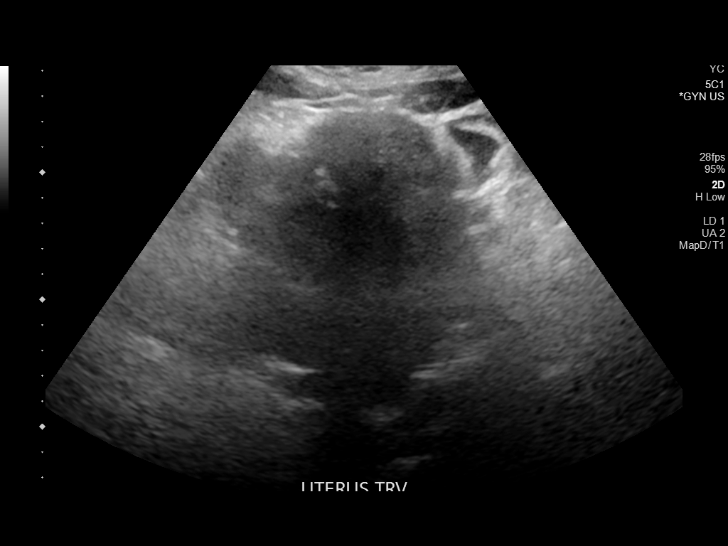
[im 22/66]
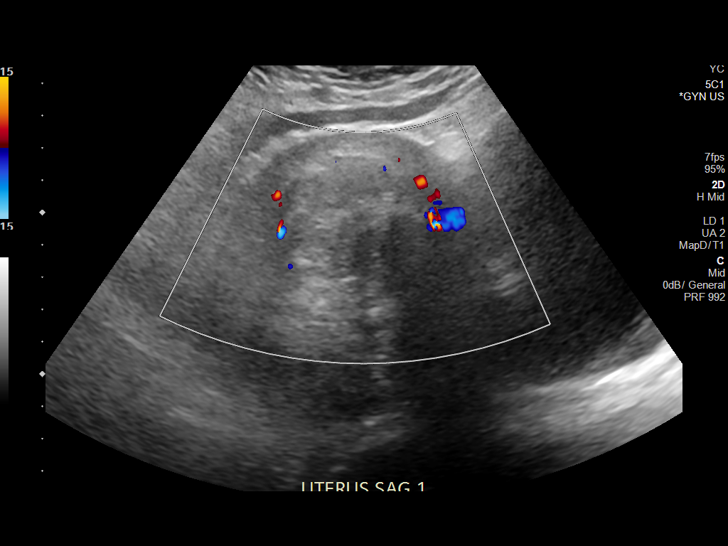
[im 28/66]
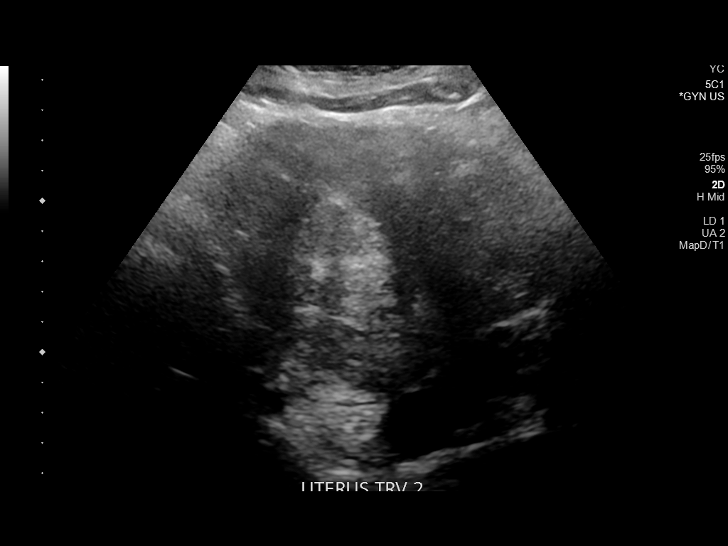
[im 33/66]
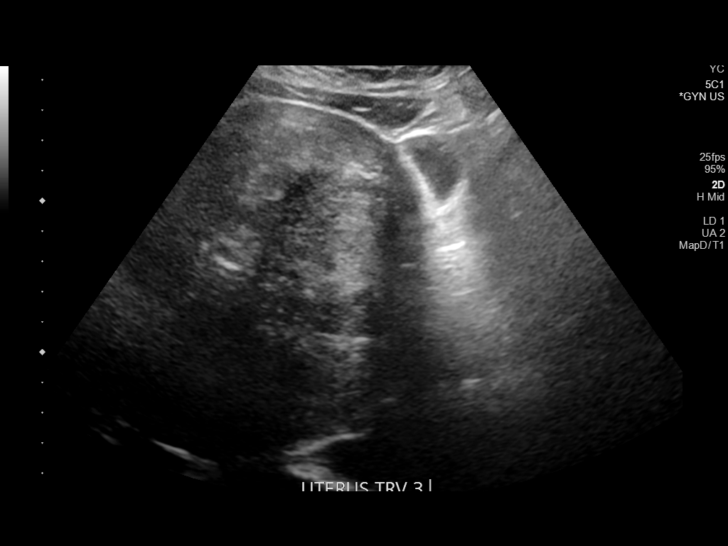
[im 38/66]
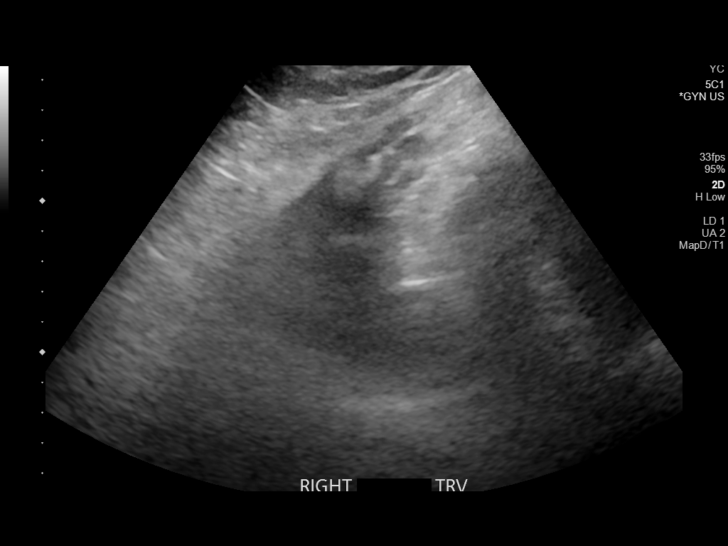
[im 44/66]
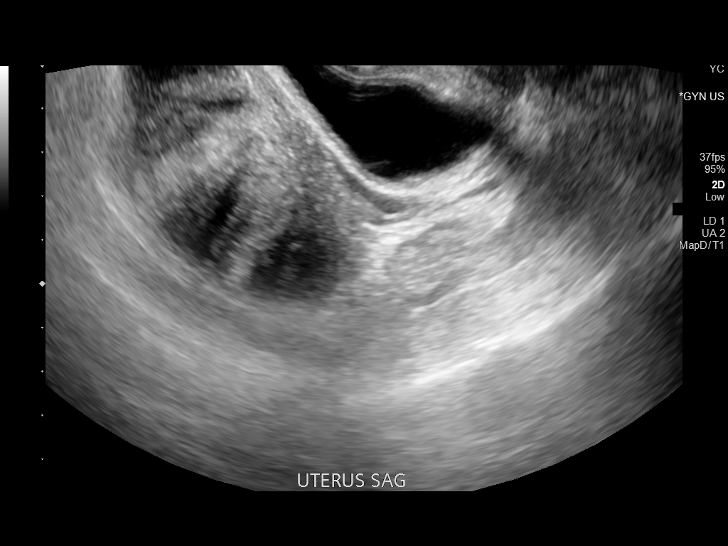
[im 49/66]
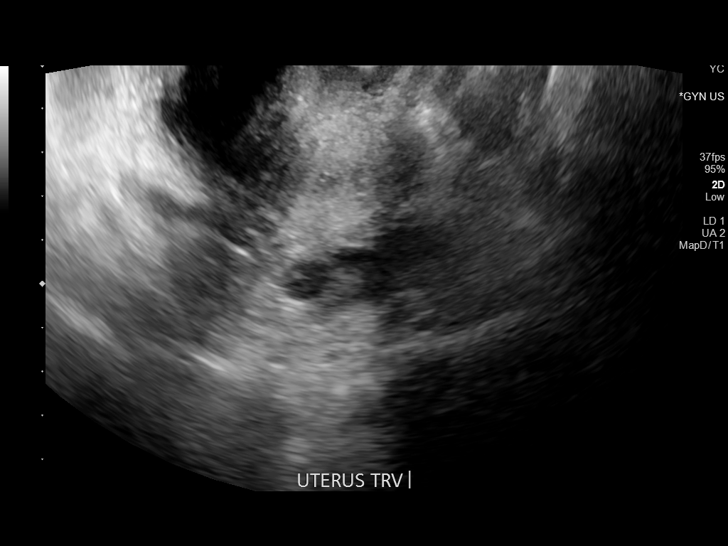
[im 55/66]
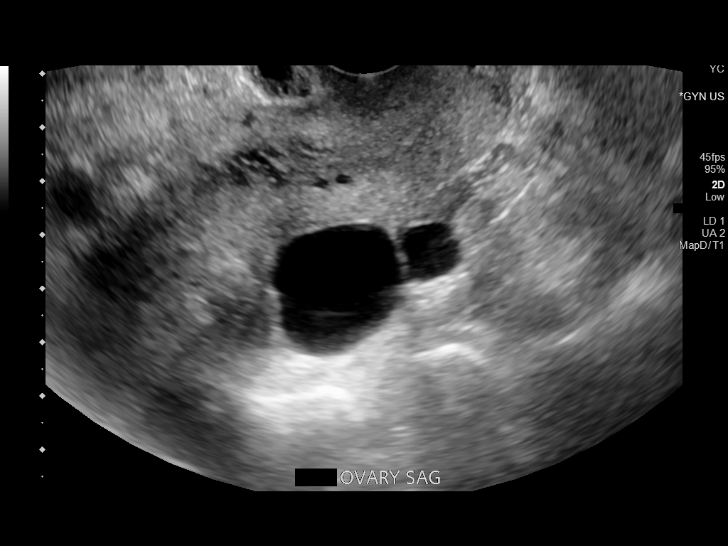
[im 60/66]
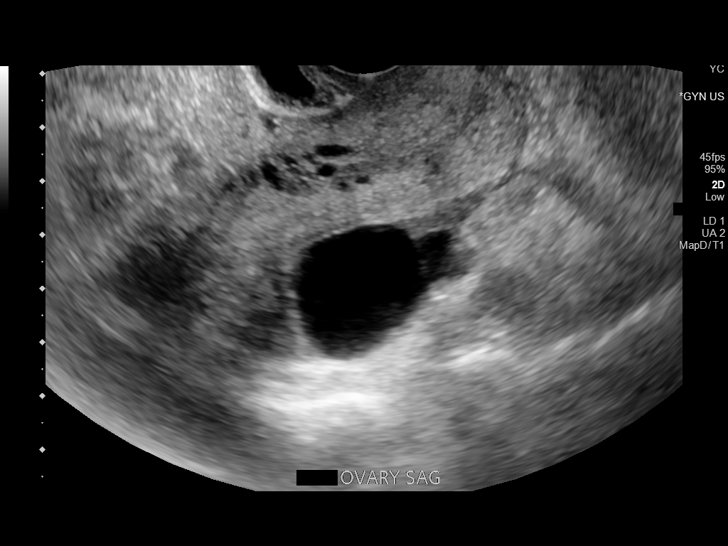
[im 66/66]
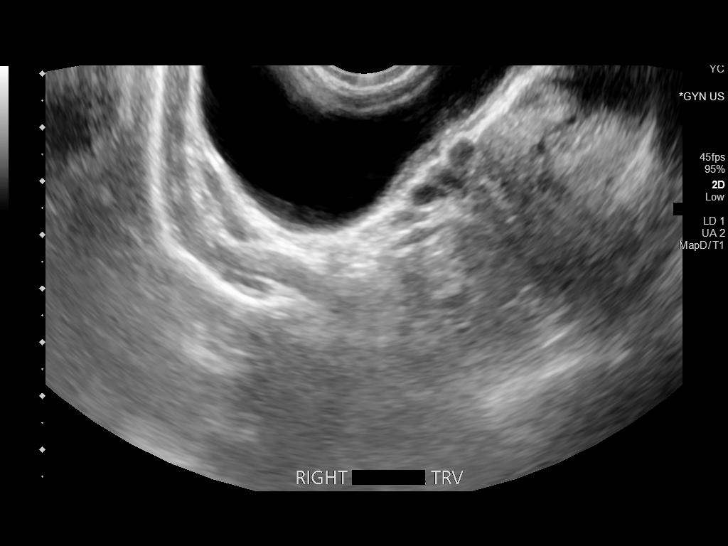

[13 of 25 positions shown; findings below may reference images not displayed]

FINDINGS: Uterus

Measurements: 17.3 x 11.6 x 13.0 cm = volume: 1371.5 mL. Multiple
uterine fibroids are seen involving the uterus. 5.2 x 5.1 x 4.7 cm
intramural fibroid present at the right uterine fundus. 5.2 x 5.3 x
4.3 cm intramural fibroid present at the mid uterine body. 6.1 x
x 5.5 cm intramural fibroid present at the left uterine fundus.

Endometrium

Not visualized or assessed due to overlying fibroids.

Right ovary

Not visualized.  No adnexal mass.

Left ovary

Measurements: 4.8 x 2.7 x 4.2 cm = volume: 28.7 mL. 2.4 x 2.2 x
cm simple cyst, likely a functional/physiologic follicular cyst. No
internal complexity, vascularity, or solid nodularity.

Other findings

Trace free fluid within the pelvis, presumably physiologic.
IMPRESSION: 1. Enlarged fibroid uterus as above.
2. Endometrial stripe not visualized or assessed due to overlying
fibroids. If there is continued clinical concern for a possible
underlying occult endometrial pathology, then further evaluation
with dedicated sonohysterography could be performed for further
evaluation as warranted.
3. 3.2 cm simple right ovarian cyst, likely a normal physiologic
follicular cyst. No follow up imaging recommended. Note: This
recommendation does not apply to premenarchal patients or to those
with increased risk (genetic, family history, elevated tumor markers
or other high-risk factors) of ovarian cancer. Reference: Radiology
[DATE]):[id number].

## 2022-08-20 ENCOUNTER — Encounter: Payer: Self-pay | Admitting: Hematology and Oncology

## 2022-08-20 ENCOUNTER — Other Ambulatory Visit: Payer: Self-pay | Admitting: *Deleted

## 2022-08-20 ENCOUNTER — Telehealth: Payer: Self-pay | Admitting: *Deleted

## 2022-08-20 DIAGNOSIS — D500 Iron deficiency anemia secondary to blood loss (chronic): Secondary | ICD-10-CM

## 2022-08-20 NOTE — Telephone Encounter (Signed)
Received call from Dr. Elaina Pattee office stating pt recent Hgb 4.8 on 8/31 and pt asymptomatic. States she educated pt to go to ED ASAP for blood transfusion.  States pt declines and would like to f/u with our office. RN placed call to pt and encouraged pt to be seen in ED.  Pt again declined and states she would like to wait until MD visit next week since she is currently asymptomatic. RN encouraged patient to seek help at closest ED if she becomes symptomatic.  Pt verbalized understanding.

## 2022-08-25 NOTE — Progress Notes (Signed)
HEMATOLOGY-ONCOLOGY TELEPHONE VISIT PROGRESS NOTE  I connected with our patient on 08/28/22 at 11:30 AM EDT by telephone and verified that I am speaking with the correct person using two identifiers.  I discussed the limitations, risks, security and privacy concerns of performing an evaluation and management service by telephone and the availability of in person appointments.  I also discussed with the patient that there may be a patient responsible charge related to this service. The patient expressed understanding and agreed to proceed.    INTERVAL HISTORY: Caitlyn King is a 36 y.o. female with above-mentioned history of anemia. She presents via telephone today for follow-up. she was originally scheduled for in person visit but she doesn't want to come today because of work issues.  She does feel fatigued but she was started on Lupron injections and her menstrual cycles have stopped.  She is hoping that this would mean that she will not require iron are and that her anemia would get better spontaneously.  So far it has not made any impact on the hemoglobin.  Repeat yesterday she had blood work which showed a ferritin of 1.  So it is possible that she might need more than 1 round of iron infusions.  REVIEW OF SYSTEMS:   Constitutional: Denies fevers, chills or abnormal weight loss All other systems were reviewed with the patient and are negative. Observations/Objective:     Assessment Plan:  Iron deficiency anemia due to chronic blood loss /08/2021: Hemoglobin 3.4, MCV 61.8, WBC 2.7, platelets 313, ANC 1.8 (iron deficiency anemia due to uterine fibroids causing heavy menstrual bleeding: 5 units of PRBC) hospitalization for COVID-pneumonia 12/23/2020-12/28/2018   10/18/2021: Hemoglobin 4.9, MCV 65, RDW 23.8, platelets 122 08/27/2022: Hemoglobin 4.8, MCV 64.5, RDW 26.4, WBC 4.3, platelets 375  Recommendation: IV iron treatment  Prior IV iron: December 2022 She is symptomatic but not to the level  that she needs blood transfusion.  In the past she was able to get away with IV iron treatments.  We are going to give her IV iron starting this Saturday.  She will get 3 Saturdays in a row of IV iron.  We will recheck her blood work 1 month after finishing this and if she needs more iron we will plan for that.  It is very likely that she might need another round of iron infusions to catch up on how severe her iron deficiency truly is.  After that if the bleeding is controlled she might be able to maintain her iron levels.  Recheck labs 1 month after completion of iron infusions   I discussed the assessment and treatment plan with the patient. The patient was provided an opportunity to ask questions and all were answered. The patient agreed with the plan and demonstrated an understanding of the instructions. The patient was advised to call back or seek an in-person evaluation if the symptoms worsen or if the condition fails to improve as anticipated.   I provided 12 minutes of non-face-to-face time during this encounter.  This includes time for charting and coordination of care   Harriette Ohara, MD

## 2022-08-27 ENCOUNTER — Other Ambulatory Visit: Payer: Self-pay

## 2022-08-27 ENCOUNTER — Inpatient Hospital Stay: Payer: BC Managed Care – PPO | Attending: Hematology and Oncology

## 2022-08-27 ENCOUNTER — Other Ambulatory Visit: Payer: Self-pay | Admitting: Hematology and Oncology

## 2022-08-27 ENCOUNTER — Encounter: Payer: Self-pay | Admitting: *Deleted

## 2022-08-27 DIAGNOSIS — D500 Iron deficiency anemia secondary to blood loss (chronic): Secondary | ICD-10-CM | POA: Diagnosis present

## 2022-08-27 DIAGNOSIS — N920 Excessive and frequent menstruation with regular cycle: Secondary | ICD-10-CM | POA: Diagnosis not present

## 2022-08-27 DIAGNOSIS — D259 Leiomyoma of uterus, unspecified: Secondary | ICD-10-CM | POA: Insufficient documentation

## 2022-08-27 DIAGNOSIS — Z79899 Other long term (current) drug therapy: Secondary | ICD-10-CM | POA: Insufficient documentation

## 2022-08-27 LAB — CBC WITH DIFFERENTIAL (CANCER CENTER ONLY)
Abs Immature Granulocytes: 0.01 10*3/uL (ref 0.00–0.07)
Basophils Absolute: 0.1 10*3/uL (ref 0.0–0.1)
Basophils Relative: 1 %
Eosinophils Absolute: 0.1 10*3/uL (ref 0.0–0.5)
Eosinophils Relative: 3 %
HCT: 19.3 % — ABNORMAL LOW (ref 36.0–46.0)
Hemoglobin: 4.8 g/dL — CL (ref 12.0–15.0)
Immature Granulocytes: 0 %
Lymphocytes Relative: 30 %
Lymphs Abs: 1.3 10*3/uL (ref 0.7–4.0)
MCH: 16.1 pg — ABNORMAL LOW (ref 26.0–34.0)
MCHC: 24.9 g/dL — ABNORMAL LOW (ref 30.0–36.0)
MCV: 64.5 fL — ABNORMAL LOW (ref 80.0–100.0)
Monocytes Absolute: 0.3 10*3/uL (ref 0.1–1.0)
Monocytes Relative: 7 %
Neutro Abs: 2.5 10*3/uL (ref 1.7–7.7)
Neutrophils Relative %: 59 %
Platelet Count: 375 10*3/uL (ref 150–400)
RBC: 2.99 MIL/uL — ABNORMAL LOW (ref 3.87–5.11)
RDW: 26.4 % — ABNORMAL HIGH (ref 11.5–15.5)
Smear Review: NORMAL
WBC Count: 4.3 10*3/uL (ref 4.0–10.5)
nRBC: 0.7 % — ABNORMAL HIGH (ref 0.0–0.2)

## 2022-08-27 LAB — CMP (CANCER CENTER ONLY)
ALT: <5 U/L (ref 0–44)
AST: 7 U/L — ABNORMAL LOW (ref 15–41)
Albumin: 4.3 g/dL (ref 3.5–5.0)
Alkaline Phosphatase: 70 U/L (ref 38–126)
Anion gap: 5 (ref 5–15)
BUN: 11 mg/dL (ref 6–20)
CO2: 24 mmol/L (ref 22–32)
Calcium: 9.3 mg/dL (ref 8.9–10.3)
Chloride: 108 mmol/L (ref 98–111)
Creatinine: 0.64 mg/dL (ref 0.44–1.00)
GFR, Estimated: >60 mL/min (ref >60)
Glucose, Bld: 101 mg/dL — ABNORMAL HIGH (ref 70–99)
Potassium: 4 mmol/L (ref 3.5–5.1)
Sodium: 137 mmol/L (ref 135–145)
Total Bilirubin: 0.4 mg/dL (ref 0.3–1.2)
Total Protein: 6.3 g/dL — ABNORMAL LOW (ref 6.5–8.1)

## 2022-08-27 LAB — IRON AND IRON BINDING CAPACITY (CC-WL,HP ONLY)
Iron: <5 ug/dL — ABNORMAL LOW (ref 28–170)
Saturation Ratios: 1 % — ABNORMAL LOW (ref 10.4–31.8)
TIBC: 451 ug/dL — ABNORMAL HIGH (ref 250–450)
UIBC: 448 ug/dL — ABNORMAL HIGH (ref 148–442)

## 2022-08-27 LAB — SAMPLE TO BLOOD BANK

## 2022-08-27 LAB — FERRITIN: Ferritin: 1 ng/mL — ABNORMAL LOW (ref 11–307)

## 2022-08-27 NOTE — Progress Notes (Signed)
CRITICAL VALUE STICKER  CRITICAL VALUE: Hgb 4.8  RECEIVER (on-site recipient of call): Merleen Nicely, RN  DATE & TIME NOTIFIED: 08/27/22 0830   MD NOTIFIED:  Nicholas Lose, MD  TIME OF NOTIFICATION: 08/27/22 at 3893  RESPONSE:  MD verbalized understanding.  States pt needing to receive IV iron.  Pt currently scheduled for f/u tomorrow 08/28/22.

## 2022-08-28 ENCOUNTER — Telehealth: Payer: Self-pay

## 2022-08-28 ENCOUNTER — Inpatient Hospital Stay: Payer: BC Managed Care – PPO

## 2022-08-28 ENCOUNTER — Inpatient Hospital Stay (HOSPITAL_BASED_OUTPATIENT_CLINIC_OR_DEPARTMENT_OTHER): Payer: BC Managed Care – PPO | Admitting: Hematology and Oncology

## 2022-08-28 DIAGNOSIS — D500 Iron deficiency anemia secondary to blood loss (chronic): Secondary | ICD-10-CM

## 2022-08-28 NOTE — Telephone Encounter (Signed)
Received message from scheduler stating pt called to ask if she can change MD appt today to a telephone visit. Attempted to call pt back to urge coming to the office d/t recent labs indicating she needs a blood xfusion/iron infusion. LVM for pt to call back.

## 2022-08-28 NOTE — Assessment & Plan Note (Signed)
/  08/2021: Hemoglobin 3.4, MCV 61.8, WBC 2.7, platelets 313, ANC 1.8 (iron deficiency anemia due to uterine fibroids causing heavy menstrual bleeding:5units of PRBC) hospitalization for COVID-pneumonia 12/23/2020-12/28/2018  10/18/2021: Hemoglobin 4.9, MCV 65, RDW 23.8, platelets 122 08/27/2022: Hemoglobin 4.8, MCV 64.5, RDW 26.4, WBC 4.3, platelets 375  Recommendation: IV iron treatment  Prior IV iron: December 2022 Patient is telling me that Caitlyn King does not want to receive blood transfusion and would like to receive the iron and see if it makes it better.  Recheck labs 1 month after completion of iron infusions

## 2022-08-29 MED FILL — Iron Sucrose Inj 20 MG/ML (Fe Equiv): INTRAVENOUS | Qty: 15 | Status: AC

## 2022-08-30 ENCOUNTER — Inpatient Hospital Stay: Payer: BC Managed Care – PPO

## 2022-08-30 VITALS — BP 113/53 | HR 71 | Temp 97.7°F | Resp 18

## 2022-08-30 DIAGNOSIS — D500 Iron deficiency anemia secondary to blood loss (chronic): Secondary | ICD-10-CM | POA: Diagnosis not present

## 2022-08-30 MED ORDER — SODIUM CHLORIDE 0.9 % IV SOLN
300.0000 mg | Freq: Once | INTRAVENOUS | Status: AC
  Administered 2022-08-30: 300 mg via INTRAVENOUS
  Filled 2022-08-30: qty 200

## 2022-08-30 NOTE — Patient Instructions (Signed)

## 2022-08-30 NOTE — Progress Notes (Signed)
Patient declined to stay 30 minute post infusion observation period. VSS, no signs of distress noted at discharge. 

## 2022-09-06 ENCOUNTER — Inpatient Hospital Stay: Payer: BC Managed Care – PPO

## 2022-09-08 ENCOUNTER — Inpatient Hospital Stay: Payer: BC Managed Care – PPO

## 2022-09-08 ENCOUNTER — Other Ambulatory Visit: Payer: Self-pay

## 2022-09-08 VITALS — BP 119/48 | HR 69 | Temp 98.5°F | Resp 18

## 2022-09-08 DIAGNOSIS — D500 Iron deficiency anemia secondary to blood loss (chronic): Secondary | ICD-10-CM

## 2022-09-08 MED ORDER — SODIUM CHLORIDE 0.9 % IV SOLN: Freq: Once | INTRAVENOUS | Status: AC

## 2022-09-08 MED ORDER — SODIUM CHLORIDE 0.9 % IV SOLN
300.0000 mg | Freq: Once | INTRAVENOUS | Status: AC
  Administered 2022-09-08: 300 mg via INTRAVENOUS
  Filled 2022-09-08: qty 300

## 2022-09-08 NOTE — Progress Notes (Signed)
Patient tolerated IV Iron refusing to stay for 30 minute post observation. VSS at discharge.

## 2022-09-08 NOTE — Patient Instructions (Signed)

## 2022-09-13 ENCOUNTER — Inpatient Hospital Stay: Payer: BC Managed Care – PPO

## 2022-09-15 ENCOUNTER — Other Ambulatory Visit: Payer: Self-pay

## 2022-09-15 ENCOUNTER — Inpatient Hospital Stay: Payer: BC Managed Care – PPO | Attending: Hematology and Oncology

## 2022-09-15 ENCOUNTER — Encounter: Payer: Self-pay | Admitting: Hematology and Oncology

## 2022-09-15 VITALS — BP 118/69 | HR 74 | Temp 98.0°F | Resp 16 | Wt 245.2 lb

## 2022-09-15 DIAGNOSIS — Z79899 Other long term (current) drug therapy: Secondary | ICD-10-CM | POA: Diagnosis not present

## 2022-09-15 DIAGNOSIS — D500 Iron deficiency anemia secondary to blood loss (chronic): Secondary | ICD-10-CM | POA: Diagnosis present

## 2022-09-15 DIAGNOSIS — N920 Excessive and frequent menstruation with regular cycle: Secondary | ICD-10-CM | POA: Insufficient documentation

## 2022-09-15 MED ORDER — SODIUM CHLORIDE 0.9 % IV SOLN: Freq: Once | INTRAVENOUS | Status: AC

## 2022-09-15 MED ORDER — SODIUM CHLORIDE 0.9 % IV SOLN
300.0000 mg | Freq: Once | INTRAVENOUS | Status: AC
  Administered 2022-09-15: 300 mg via INTRAVENOUS
  Filled 2022-09-15: qty 300

## 2022-09-15 NOTE — Patient Instructions (Signed)

## 2022-09-15 NOTE — Progress Notes (Signed)
Pt. states she has been tolerating Venofer treatments without any issues and declines to stay for 30 minute post observation. Vital signs stable, pt. left via ambulation, no shortness of breath noted.

## 2022-10-02 NOTE — Progress Notes (Signed)
HEMATOLOGY-ONCOLOGY TELEPHONE VISIT PROGRESS NOTE  Patient did not come for her blood work and did not answer her telephone. If she calls we will need to schedule her for a lab appointment and a telephone visit 2 days later to discuss results.

## 2022-10-06 ENCOUNTER — Inpatient Hospital Stay: Payer: BC Managed Care – PPO

## 2022-10-07 ENCOUNTER — Inpatient Hospital Stay (HOSPITAL_BASED_OUTPATIENT_CLINIC_OR_DEPARTMENT_OTHER): Payer: BC Managed Care – PPO | Admitting: Hematology and Oncology

## 2022-10-07 DIAGNOSIS — D500 Iron deficiency anemia secondary to blood loss (chronic): Secondary | ICD-10-CM

## 2022-10-07 NOTE — Assessment & Plan Note (Signed)
Lab review: 12/23/2020: Hemoglobin 3.4, MCV 61.8, WBC 2.7, platelets 313, ANC 1.8 (iron deficiency anemia due to uterine fibroids causing heavy menstrual bleeding:5units of PRBC) hospitalization for COVID-pneumonia 12/23/2020-12/28/2018  10/18/2021: Hemoglobin 4.9, MCV 65, RDW 23.8, platelets 122 08/27/2022: Hemoglobin 4.8, MCV 64.5, RDW 26.4, WBC 4.3, platelets 375  Prior IV iron: December 2022, September 2023

## 2024-05-11 ENCOUNTER — Telehealth: Payer: Self-pay

## 2024-05-11 NOTE — Telephone Encounter (Signed)
 I have made multiple attempts to call the patient over the span of a week and also sent her a mychart message. I will need an update insurance card or the info from the card in order to process her Venofer  and get her scheduled.

## 2024-06-24 ENCOUNTER — Other Ambulatory Visit: Payer: Self-pay | Admitting: Obstetrics and Gynecology

## 2024-06-24 ENCOUNTER — Telehealth: Payer: Self-pay

## 2024-06-24 ENCOUNTER — Encounter: Payer: Self-pay | Admitting: Hematology and Oncology

## 2024-06-24 DIAGNOSIS — D509 Iron deficiency anemia, unspecified: Secondary | ICD-10-CM | POA: Insufficient documentation

## 2024-06-24 DIAGNOSIS — D649 Anemia, unspecified: Secondary | ICD-10-CM | POA: Insufficient documentation

## 2024-06-24 NOTE — Telephone Encounter (Addendum)
 Dr. Laurence, patient will be scheduled as soon as possible. She has been difficult to get on the phone. Hopefully we can get her in soon.  Auth Submission: NO AUTH NEEDED Site of care: Site of care: [facility] Payer: [employer] commercial Medication & CPT/J Code(s) submitted: Venofer  (Iron  Sucrose) J1756 Diagnosis Code:  Route of submission (phone, fax, portal):  Phone # Fax # Auth type: Buy/Bill PB Units/visits requested: 200mg  x 5 doses Reference number:  Approval from: 06/24/24 to 10/25/24

## 2024-06-24 NOTE — Telephone Encounter (Signed)
 We have not been able to reach this patient to get the correct insurance information for her.

## 2024-06-24 NOTE — Telephone Encounter (Signed)
 Please disregard this message. The insurance information has been received and we will get this referral processed as soon as possible.

## 2024-06-24 NOTE — Progress Notes (Signed)
 Patient has an IV iron  order for anemia (D64.9) from Dr. Slater Door. Based on patient's insurance, will order Venofer  200 mg IV x 5  Ionia Schey D. Jaliel Deavers, PharmD

## 2024-10-13 ENCOUNTER — Inpatient Hospital Stay

## 2024-10-13 ENCOUNTER — Inpatient Hospital Stay: Admitting: Hematology and Oncology

## 2024-10-18 ENCOUNTER — Inpatient Hospital Stay

## 2024-10-18 ENCOUNTER — Inpatient Hospital Stay: Attending: Hematology and Oncology | Admitting: Hematology and Oncology

## 2024-10-18 VITALS — BP 122/50 | HR 93 | Temp 97.9°F | Resp 18 | Ht 67.0 in | Wt 257.6 lb

## 2024-10-18 DIAGNOSIS — Z79899 Other long term (current) drug therapy: Secondary | ICD-10-CM | POA: Insufficient documentation

## 2024-10-18 DIAGNOSIS — Z8701 Personal history of pneumonia (recurrent): Secondary | ICD-10-CM | POA: Insufficient documentation

## 2024-10-18 DIAGNOSIS — N920 Excessive and frequent menstruation with regular cycle: Secondary | ICD-10-CM | POA: Diagnosis not present

## 2024-10-18 DIAGNOSIS — R5383 Other fatigue: Secondary | ICD-10-CM | POA: Insufficient documentation

## 2024-10-18 DIAGNOSIS — Z8616 Personal history of COVID-19: Secondary | ICD-10-CM | POA: Insufficient documentation

## 2024-10-18 DIAGNOSIS — D500 Iron deficiency anemia secondary to blood loss (chronic): Secondary | ICD-10-CM | POA: Insufficient documentation

## 2024-10-18 DIAGNOSIS — F5089 Other specified eating disorder: Secondary | ICD-10-CM | POA: Insufficient documentation

## 2024-10-18 DIAGNOSIS — D259 Leiomyoma of uterus, unspecified: Secondary | ICD-10-CM | POA: Diagnosis not present

## 2024-10-18 NOTE — Progress Notes (Signed)
 [facility] CONSULT NOTE  Patient Care Team: Patient, No Pcp Per as PCP - General (General Practice)  CHIEF COMPLAINTS/PURPOSE OF CONSULTATION:  Recurrent iron  deficiency anemia  HISTORY OF PRESENTING ILLNESS:   History of Present Illness Caitlyn King is a 38 year old female with fibroids and iron  deficiency anemia who presents with fatigue and low iron  levels.  We have seen her previously and given her IV iron  infusion in September 2023.  However because of multiple reasons patient did not follow-up with us .  She stays extremely busy with work and family.  The anemia is felt to be related to bleeding fibroids.  She experiences significant fatigue, particularly impacting her ability to perform her active job duties. Her iron  levels were low in July, with a hemoglobin level of 6.5 as of Apr 28, 2024. She has a history of iron  deficiency anemia and previously received Lupron treatment, which was discontinued due to side effects and lack of efficacy. She has not been taking iron  supplements recently due to her slow effect.  She was hospitalized for low iron  levels, coinciding with a COVID-19 infection, and received a blood transfusion during that time. She experiences pica, specifically cravings for ice, which she associates with her low iron  levels. Her demanding work schedule at [employer] makes it challenging to attend medical appointments.  I reviewed her records extensively and collaborated the history with the patient.   MEDICAL HISTORY:  Past Medical History:  Diagnosis Date   Hard of hearing     SURGICAL HISTORY: No prior surgeries  SOCIAL HISTORY: Social History   Socioeconomic History   Marital status: Single    Spouse name: Not on file   Number of children: Not on file   Years of education: Not on file   Highest education level: Not on file  Occupational History   Not on file  Tobacco Use   Smoking status: Never   Smokeless tobacco: Never   Substance and Sexual Activity   Alcohol use: Never   Drug use: Not on file   Sexual activity: Not on file  Other Topics Concern   Not on file  Social History Narrative   Not on file   Social Drivers of Health   Financial Resource Strain: Not on file  Food Insecurity: No Food Insecurity (10/18/2024)   Hunger Vital Sign    Worried About Running Out of Food in the Last Year: Never true    Ran Out of Food in the Last Year: Never true  Transportation Needs: No Transportation Needs (10/18/2024)   PRAPARE - Administrator, Civil Service (Medical): No    Lack of Transportation (Non-Medical): No  Physical Activity: Not on file  Stress: Not on file  Social Connections: Not on file  Intimate Partner Violence: Not At Risk (10/18/2024)   Humiliation, Afraid, Rape, and Kick questionnaire    Fear of Current or Ex-Partner: No    Emotionally Abused: No    Physically Abused: No    Sexually Abused: No    FAMILY HISTORY: No family history of cancers  ALLERGIES:  has no known allergies.  MEDICATIONS:  Current Outpatient Medications  Medication Sig Dispense Refill   semaglutide-weight management (WEGOVY) 0.5 MG/0.5ML SOAJ SQ injection Inject 0.5 mg into the skin.     No current facility-administered medications for this visit.    REVIEW OF SYSTEMS:   Constitutional: Denies fevers, chills or abnormal night sweats All other systems were reviewed with the  patient and are negative.  PHYSICAL EXAMINATION: ECOG PERFORMANCE STATUS: 2 - Symptomatic, <50% confined to bed  Vitals:   10/18/24 1552  BP: (!) 122/50  Pulse: 93  Resp: 18  Temp: 97.9 F (36.6 C)  SpO2: 100%   Filed Weights   10/18/24 1552  Weight: 257 lb 9.6 oz (116.8 kg)    GENERAL:alert, no distress and comfortable  LABORATORY DATA:  I have reviewed the data as listed Lab Results  Component Value Date   WBC 4.3 08/27/2022   HGB 4.8 (LL) 08/27/2022   HCT 19.3 (L) 08/27/2022   MCV 64.5 (L) 08/27/2022    PLT 375 08/27/2022   Lab Results  Component Value Date   NA 137 08/27/2022   K 4.0 08/27/2022   CL 108 08/27/2022   CO2 24 08/27/2022      ASSESSMENT AND PLAN:  Iron  deficiency anemia due to chronic blood loss 12/23/2020: Hemoglobin 3.4, MCV 61.8, WBC 2.7, platelets 313, ANC 1.8 (iron  deficiency anemia due to uterine fibroids causing heavy menstrual bleeding: 5 units of PRBC) hospitalization for COVID-pneumonia 12/23/2020-12/28/2018   10/18/2021: Hemoglobin 4.9, MCV 65, RDW 23.8, platelets 122 08/27/22: Hb 4.8, MCV 64.5, Iron  sat: 1%, Ferritin 1 (3 doses of IV iron  given) 04/29/2024: Hemoglobin 6.5, MCV 63, RDW 19.8, iron  saturation 2%, ferritin 4   IV iron : December 2022, 08/2022 Recommendation: IV iron  therapy (patient is only able to come on Saturdays.  Therefore we will set her up for IV iron  at [facility] on Saturdays) Previously she had tolerated IV iron  fairly well.  Because of her frequent anemia episodes, I recommended that she fill out intermittent FMLA paperwork. Recheck labs in 2 months and telephone visit 2 days later to discuss results.     All questions were answered. The patient knows to call the clinic with any problems, questions or concerns.  I personally spent a total of 60 minutes in the care of the patient today including preparing to see the patient, getting/reviewing separately obtained history, performing a medically appropriate exam/evaluation, counseling and educating, placing orders, referring and communicating with other health care professionals, documenting clinical information in the EHR, independently interpreting results, communicating results, and coordinating care.    Viinay K Herta Hink, MD 10/18/24

## 2024-10-18 NOTE — Assessment & Plan Note (Signed)
 12/23/2020: Hemoglobin 3.4, MCV 61.8, WBC 2.7, platelets 313, ANC 1.8 (iron  deficiency anemia due to uterine fibroids causing heavy menstrual bleeding: 5 units of PRBC) hospitalization for COVID-pneumonia 12/23/2020-12/28/2018   10/18/2021: Hemoglobin 4.9, MCV 65, RDW 23.8, platelets 122 08/27/22: Hb 4.8, MCV 64.5, Iron  sat: 1%, Ferritin 1   IV iron : December 2022, 08/2022 Recommendation:

## 2024-10-21 ENCOUNTER — Telehealth: Payer: Self-pay

## 2024-10-21 MED FILL — Iron Sucrose Inj 20 MG/ML (Fe Equiv): Qty: 265 | Status: AC

## 2024-10-21 NOTE — Telephone Encounter (Signed)
 Notified the pt egarding her FMLA forms being completed, faxed, and confirmation received. Pt verbalized understanding.

## 2024-10-22 ENCOUNTER — Telehealth: Payer: Self-pay

## 2024-10-22 ENCOUNTER — Inpatient Hospital Stay

## 2024-10-25 ENCOUNTER — Telehealth: Payer: Self-pay | Admitting: Hematology and Oncology

## 2024-10-25 NOTE — Telephone Encounter (Signed)
 left vm for patient to call back and schedule missed appt and other f/u appts

## 2024-11-04 MED FILL — Iron Sucrose Inj 20 MG/ML (Fe Equiv): Qty: 265 | Status: AC

## 2024-11-05 ENCOUNTER — Inpatient Hospital Stay

## 2024-11-05 VITALS — BP 93/50 | HR 70 | Temp 98.1°F | Resp 18

## 2024-11-05 DIAGNOSIS — D500 Iron deficiency anemia secondary to blood loss (chronic): Secondary | ICD-10-CM

## 2024-11-05 MED ORDER — SODIUM CHLORIDE 0.9 % IV SOLN: INTRAVENOUS | Status: DC

## 2024-11-05 MED ORDER — IRON SUCROSE 300 MG IVPB - SIMPLE MED
300.0000 mg | Freq: Once | Status: AC
  Administered 2024-11-05: 300 mg via INTRAVENOUS
  Filled 2024-11-05: qty 265
  Filled 2024-11-05: qty 200

## 2024-11-19 ENCOUNTER — Inpatient Hospital Stay

## 2024-11-19 ENCOUNTER — Inpatient Hospital Stay: Attending: Hematology and Oncology

## 2024-11-19 VITALS — BP 104/39 | HR 71 | Temp 98.2°F | Resp 16

## 2024-11-19 DIAGNOSIS — D500 Iron deficiency anemia secondary to blood loss (chronic): Secondary | ICD-10-CM | POA: Diagnosis present

## 2024-11-19 DIAGNOSIS — Z79899 Other long term (current) drug therapy: Secondary | ICD-10-CM | POA: Insufficient documentation

## 2024-11-19 DIAGNOSIS — N920 Excessive and frequent menstruation with regular cycle: Secondary | ICD-10-CM | POA: Diagnosis not present

## 2024-11-19 MED ORDER — SODIUM CHLORIDE 0.9 % IV SOLN: INTRAVENOUS | Status: DC

## 2024-11-19 MED ORDER — IRON SUCROSE 300 MG IVPB - SIMPLE MED
300.0000 mg | Freq: Once | Status: AC
  Administered 2024-11-19: 300 mg via INTRAVENOUS
  Filled 2024-11-19: qty 300

## 2024-11-19 NOTE — Patient Instructions (Signed)

## 2024-11-21 ENCOUNTER — Telehealth: Payer: Self-pay | Admitting: Hematology and Oncology

## 2024-11-21 NOTE — Telephone Encounter (Signed)
 I spoke with patient to reschedule Iron  infusion to 12/03/2024. Patient aware of date/time change.

## 2024-11-24 ENCOUNTER — Inpatient Hospital Stay: Attending: Hematology and Oncology

## 2024-12-02 MED FILL — Iron Sucrose Inj 20 MG/ML (Fe Equiv): Qty: 265 | Status: AC

## 2024-12-03 ENCOUNTER — Inpatient Hospital Stay

## 2024-12-03 VITALS — BP 118/60 | HR 69 | Temp 97.2°F | Resp 18

## 2024-12-03 DIAGNOSIS — D500 Iron deficiency anemia secondary to blood loss (chronic): Secondary | ICD-10-CM | POA: Diagnosis not present

## 2024-12-03 MED ORDER — IRON SUCROSE 300 MG IVPB - SIMPLE MED
300.0000 mg | Freq: Once | Status: AC
  Administered 2024-12-03: 300 mg via INTRAVENOUS
  Filled 2024-12-03: qty 200

## 2024-12-03 MED ORDER — SODIUM CHLORIDE 0.9 % IV SOLN: INTRAVENOUS | Status: DC

## 2024-12-03 NOTE — Patient Instructions (Signed)

## 2024-12-15 ENCOUNTER — Encounter: Payer: Self-pay | Admitting: Hematology and Oncology

## 2024-12-15 ENCOUNTER — Encounter: Payer: Self-pay | Admitting: Obstetrics and Gynecology

## 2024-12-22 ENCOUNTER — Encounter: Payer: Self-pay | Admitting: Hematology and Oncology

## 2024-12-22 ENCOUNTER — Inpatient Hospital Stay: Payer: Self-pay | Attending: Hematology and Oncology

## 2024-12-22 ENCOUNTER — Encounter: Payer: Self-pay | Admitting: Obstetrics and Gynecology

## 2024-12-22 DIAGNOSIS — N920 Excessive and frequent menstruation with regular cycle: Secondary | ICD-10-CM | POA: Diagnosis not present

## 2024-12-22 DIAGNOSIS — Z8701 Personal history of pneumonia (recurrent): Secondary | ICD-10-CM | POA: Insufficient documentation

## 2024-12-22 DIAGNOSIS — Z8616 Personal history of COVID-19: Secondary | ICD-10-CM | POA: Diagnosis not present

## 2024-12-22 DIAGNOSIS — Z79899 Other long term (current) drug therapy: Secondary | ICD-10-CM | POA: Diagnosis not present

## 2024-12-22 DIAGNOSIS — Z86018 Personal history of other benign neoplasm: Secondary | ICD-10-CM | POA: Insufficient documentation

## 2024-12-22 DIAGNOSIS — K5900 Constipation, unspecified: Secondary | ICD-10-CM | POA: Diagnosis not present

## 2024-12-22 DIAGNOSIS — D500 Iron deficiency anemia secondary to blood loss (chronic): Secondary | ICD-10-CM | POA: Insufficient documentation

## 2024-12-22 DIAGNOSIS — N939 Abnormal uterine and vaginal bleeding, unspecified: Secondary | ICD-10-CM | POA: Insufficient documentation

## 2024-12-22 LAB — CBC WITH DIFFERENTIAL (CANCER CENTER ONLY)
Abs Immature Granulocytes: 0.02 K/uL (ref 0.00–0.07)
Basophils Absolute: 0.1 K/uL (ref 0.0–0.1)
Basophils Relative: 1 %
Eosinophils Absolute: 0.3 K/uL (ref 0.0–0.5)
Eosinophils Relative: 4 %
HCT: 29.8 % — ABNORMAL LOW (ref 36.0–46.0)
Hemoglobin: 7.9 g/dL — ABNORMAL LOW (ref 12.0–15.0)
Immature Granulocytes: 0 %
Lymphocytes Relative: 22 %
Lymphs Abs: 1.8 K/uL (ref 0.7–4.0)
MCH: 19.2 pg — ABNORMAL LOW (ref 26.0–34.0)
MCHC: 26.5 g/dL — ABNORMAL LOW (ref 30.0–36.0)
MCV: 72.3 fL — ABNORMAL LOW (ref 80.0–100.0)
Monocytes Absolute: 0.5 K/uL (ref 0.1–1.0)
Monocytes Relative: 6 %
Neutro Abs: 5.2 K/uL (ref 1.7–7.7)
Neutrophils Relative %: 67 %
Platelet Count: 366 K/uL (ref 150–400)
RBC: 4.12 MIL/uL (ref 3.87–5.11)
RDW: 24.2 % — ABNORMAL HIGH (ref 11.5–15.5)
WBC Count: 7.9 K/uL (ref 4.0–10.5)
nRBC: 0 % (ref 0.0–0.2)

## 2024-12-22 LAB — IRON AND IRON BINDING CAPACITY (CC-WL,HP ONLY)
Iron: 11 ug/dL — ABNORMAL LOW (ref 28–170)
Saturation Ratios: 3 % — ABNORMAL LOW (ref 10.4–31.8)
TIBC: 357 ug/dL (ref 250–450)
UIBC: 346 ug/dL

## 2024-12-22 LAB — FERRITIN: Ferritin: 13 ng/mL (ref 11–307)

## 2024-12-26 ENCOUNTER — Inpatient Hospital Stay: Payer: Self-pay | Admitting: Hematology and Oncology

## 2024-12-26 DIAGNOSIS — D500 Iron deficiency anemia secondary to blood loss (chronic): Secondary | ICD-10-CM

## 2024-12-26 NOTE — Assessment & Plan Note (Signed)
 12/23/2020: Hemoglobin 3.4, MCV 61.8, WBC 2.7, platelets 313, ANC 1.8 (iron  deficiency anemia due to uterine fibroids causing heavy menstrual bleeding: 5 units of PRBC) hospitalization for COVID-pneumonia 12/23/2020-12/28/2018   10/18/2021: Hemoglobin 4.9, MCV 65, RDW 23.8, platelets 122 08/27/22: Hb 4.8, MCV 64.5, Iron  sat: 1%, Ferritin 1 (3 doses of IV iron  given) 04/29/2024: Hemoglobin 6.5, MCV 63, RDW 19.8, iron  saturation 2%, ferritin 4 12/22/2024: Hemoglobin 7.9, MCV 72.3, iron  saturation 3%, ferritin 13   IV iron : December 2022, 08/2022, 09/2024  Recommend additional IV iron  therapy

## 2024-12-26 NOTE — Progress Notes (Signed)
 HEMATOLOGY-ONCOLOGY TELEPHONE VISIT PROGRESS NOTE  I connected with our patient on 12/26/2024 at 11:30 AM EST by telephone and verified that I am speaking with the correct person using two identifiers.  I discussed the limitations, risks, security and privacy concerns of performing an evaluation and management service by telephone and the availability of in person appointments.  I also discussed with the patient that there may be a patient responsible charge related to this service. The patient expressed understanding and agreed to proceed.   History of Present Illness: Telephone follow-up of iron  deficiency anemia  History of Present Illness Caitlyn King is a 39 year old female with iron  deficiency anemia secondary to chronic uterine blood loss who presents for follow-up of persistent anemia.  Recent labs show hemoglobin 7.9 g/dL, iron  saturation 3%, and ferritin 13 ng/mL. She received IV iron  infusions in November and December 2025.  She feels well overall but is concerned about the persistently low values and her response to IV iron .  She is not on prescription iron . She takes over-the-counter oral iron  as tolerated and avoids products that cause constipation.  She prefers weekend treatments due to her job at [employer] and has been receiving infusions at the cancer center.    REVIEW OF SYSTEMS:   Constitutional: Denies fevers, chills or abnormal weight loss All other systems were reviewed with the patient and are negative. Observations/Objective:     Assessment Plan:  Iron  deficiency anemia due to chronic blood loss 12/23/2020: Hemoglobin 3.4, MCV 61.8, WBC 2.7, platelets 313, ANC 1.8 (iron  deficiency anemia due to uterine fibroids causing heavy menstrual bleeding: 5 units of PRBC) hospitalization for COVID-pneumonia 12/23/2020-12/28/2018   10/18/2021: Hemoglobin 4.9, MCV 65, RDW 23.8, platelets 122 08/27/22: Hb 4.8, MCV 64.5, Iron  sat: 1%, Ferritin 1 (3 doses of IV iron   given) 04/29/2024: Hemoglobin 6.5, MCV 63, RDW 19.8, iron  saturation 2%, ferritin 4 12/22/2024: Hemoglobin 7.9, MCV 72.3, iron  saturation 3%, ferritin 13   IV iron : December 2022, 08/2022, 10/2024  Recommend additional IV iron  therapy Labs in 3 months and telephone visit after that  I discussed the assessment and treatment plan with the patient. The patient was provided an opportunity to ask questions and all were answered. The patient agreed with the plan and demonstrated an understanding of the instructions. The patient was advised to call back or seek an in-person evaluation if the symptoms worsen or if the condition fails to improve as anticipated.   I provided 20 minutes of non-face-to-face time during this encounter.  This includes time for charting and coordination of care   Naomi MARLA Chad, MD

## 2024-12-27 ENCOUNTER — Telehealth: Payer: Self-pay | Admitting: Hematology and Oncology

## 2024-12-27 NOTE — Telephone Encounter (Signed)
 Left vm for pt to call back and schedule appts per 1/12 los notes
